# Patient Record
Sex: Male | Born: 1955 | Race: White | Hispanic: No | Marital: Single | Smoking: Never smoker
Health system: Southern US, Community
[De-identification: ages and names within clinical notes are randomized; demographics above are authoritative.]

## PROBLEM LIST (undated history)

## (undated) ENCOUNTER — Emergency Department (HOSPITAL_COMMUNITY): Payer: Self-pay | Source: Home / Self Care

## (undated) DIAGNOSIS — M1990 Unspecified osteoarthritis, unspecified site: Secondary | ICD-10-CM

## (undated) DIAGNOSIS — Z8489 Family history of other specified conditions: Secondary | ICD-10-CM

## (undated) DIAGNOSIS — R51 Headache: Secondary | ICD-10-CM

## (undated) DIAGNOSIS — I10 Essential (primary) hypertension: Secondary | ICD-10-CM

## (undated) DIAGNOSIS — E7800 Pure hypercholesterolemia, unspecified: Secondary | ICD-10-CM

## (undated) DIAGNOSIS — I209 Angina pectoris, unspecified: Secondary | ICD-10-CM

## (undated) DIAGNOSIS — Z87898 Personal history of other specified conditions: Secondary | ICD-10-CM

## (undated) DIAGNOSIS — Z9861 Coronary angioplasty status: Secondary | ICD-10-CM

## (undated) DIAGNOSIS — I2510 Atherosclerotic heart disease of native coronary artery without angina pectoris: Secondary | ICD-10-CM

## (undated) DIAGNOSIS — I2119 ST elevation (STEMI) myocardial infarction involving other coronary artery of inferior wall: Secondary | ICD-10-CM

## (undated) DIAGNOSIS — E119 Type 2 diabetes mellitus without complications: Secondary | ICD-10-CM

## (undated) DIAGNOSIS — R519 Headache, unspecified: Secondary | ICD-10-CM

## (undated) DIAGNOSIS — F419 Anxiety disorder, unspecified: Secondary | ICD-10-CM

## (undated) HISTORY — PX: NASAL SEPTUM SURGERY: SHX37

## (undated) HISTORY — PX: CORONARY ANGIOPLASTY WITH STENT PLACEMENT: SHX49

---

## 1984-06-02 HISTORY — PX: INGUINAL HERNIA REPAIR: SUR1180

## 1984-06-02 HISTORY — PX: CARPAL TUNNEL RELEASE: SHX101

## 2002-11-11 ENCOUNTER — Emergency Department (HOSPITAL_COMMUNITY): Admission: EM | Admit: 2002-11-11 | Discharge: 2002-11-11 | Payer: Self-pay | Admitting: Emergency Medicine

## 2004-05-17 ENCOUNTER — Ambulatory Visit: Payer: Self-pay | Admitting: Family Medicine

## 2005-03-24 ENCOUNTER — Ambulatory Visit: Payer: Self-pay | Admitting: Family Medicine

## 2005-04-14 ENCOUNTER — Ambulatory Visit: Payer: Self-pay | Admitting: Family Medicine

## 2005-05-02 ENCOUNTER — Ambulatory Visit: Payer: Self-pay | Admitting: Family Medicine

## 2005-05-30 ENCOUNTER — Ambulatory Visit: Payer: Self-pay | Admitting: Family Medicine

## 2005-06-27 ENCOUNTER — Ambulatory Visit: Payer: Self-pay | Admitting: Family Medicine

## 2005-07-11 ENCOUNTER — Ambulatory Visit: Payer: Self-pay | Admitting: Family Medicine

## 2013-11-02 DIAGNOSIS — E119 Type 2 diabetes mellitus without complications: Secondary | ICD-10-CM | POA: Insufficient documentation

## 2013-11-02 DIAGNOSIS — E785 Hyperlipidemia, unspecified: Secondary | ICD-10-CM | POA: Insufficient documentation

## 2013-11-02 DIAGNOSIS — I10 Essential (primary) hypertension: Secondary | ICD-10-CM | POA: Insufficient documentation

## 2016-02-13 DIAGNOSIS — E785 Hyperlipidemia, unspecified: Secondary | ICD-10-CM

## 2016-02-13 DIAGNOSIS — E1169 Type 2 diabetes mellitus with other specified complication: Secondary | ICD-10-CM | POA: Insufficient documentation

## 2016-02-13 DIAGNOSIS — I10 Essential (primary) hypertension: Secondary | ICD-10-CM | POA: Insufficient documentation

## 2016-08-04 ENCOUNTER — Inpatient Hospital Stay (HOSPITAL_COMMUNITY)
Admission: EM | Admit: 2016-08-04 | Discharge: 2016-08-08 | DRG: 247 | Disposition: A | Discharge status: Home / Self Care | Payer: Managed Care, Other (non HMO) | Source: Ambulatory Visit | Attending: Cardiovascular Disease | Admitting: Cardiovascular Disease

## 2016-08-04 ENCOUNTER — Encounter (HOSPITAL_COMMUNITY): Payer: Self-pay | Admitting: Cardiology

## 2016-08-04 ENCOUNTER — Encounter (HOSPITAL_COMMUNITY)
Admission: EM | Disposition: A | Discharge status: Home / Self Care | Payer: Self-pay | Source: Ambulatory Visit | Attending: Cardiovascular Disease

## 2016-08-04 DIAGNOSIS — I2119 ST elevation (STEMI) myocardial infarction involving other coronary artery of inferior wall: Secondary | ICD-10-CM

## 2016-08-04 DIAGNOSIS — I2121 ST elevation (STEMI) myocardial infarction involving left circumflex coronary artery: Secondary | ICD-10-CM | POA: Diagnosis present

## 2016-08-04 DIAGNOSIS — E785 Hyperlipidemia, unspecified: Secondary | ICD-10-CM | POA: Diagnosis present

## 2016-08-04 DIAGNOSIS — I2510 Atherosclerotic heart disease of native coronary artery without angina pectoris: Secondary | ICD-10-CM | POA: Insufficient documentation

## 2016-08-04 DIAGNOSIS — E118 Type 2 diabetes mellitus with unspecified complications: Secondary | ICD-10-CM | POA: Diagnosis not present

## 2016-08-04 DIAGNOSIS — Z8249 Family history of ischemic heart disease and other diseases of the circulatory system: Secondary | ICD-10-CM

## 2016-08-04 DIAGNOSIS — R110 Nausea: Secondary | ICD-10-CM | POA: Diagnosis present

## 2016-08-04 DIAGNOSIS — E7800 Pure hypercholesterolemia, unspecified: Secondary | ICD-10-CM | POA: Diagnosis not present

## 2016-08-04 DIAGNOSIS — E119 Type 2 diabetes mellitus without complications: Secondary | ICD-10-CM

## 2016-08-04 DIAGNOSIS — Z955 Presence of coronary angioplasty implant and graft: Secondary | ICD-10-CM

## 2016-08-04 DIAGNOSIS — I10 Essential (primary) hypertension: Secondary | ICD-10-CM

## 2016-08-04 DIAGNOSIS — E088 Diabetes mellitus due to underlying condition with unspecified complications: Secondary | ICD-10-CM | POA: Diagnosis not present

## 2016-08-04 DIAGNOSIS — R079 Chest pain, unspecified: Secondary | ICD-10-CM

## 2016-08-04 HISTORY — DX: Type 2 diabetes mellitus without complications: E11.9

## 2016-08-04 HISTORY — DX: Coronary angioplasty status: Z98.61

## 2016-08-04 HISTORY — DX: Atherosclerotic heart disease of native coronary artery without angina pectoris: I25.10

## 2016-08-04 HISTORY — DX: Anxiety disorder, unspecified: F41.9

## 2016-08-04 HISTORY — DX: Pure hypercholesterolemia, unspecified: E78.00

## 2016-08-04 HISTORY — DX: Headache: R51

## 2016-08-04 HISTORY — PX: CORONARY STENT INTERVENTION: CATH118234

## 2016-08-04 HISTORY — DX: Essential (primary) hypertension: I10

## 2016-08-04 HISTORY — DX: ST elevation (STEMI) myocardial infarction involving other coronary artery of inferior wall: I21.19

## 2016-08-04 HISTORY — PX: LEFT HEART CATH AND CORONARY ANGIOGRAPHY: CATH118249

## 2016-08-04 HISTORY — DX: Angina pectoris, unspecified: I20.9

## 2016-08-04 HISTORY — DX: Personal history of other specified conditions: Z87.898

## 2016-08-04 HISTORY — DX: Headache, unspecified: R51.9

## 2016-08-04 HISTORY — DX: Family history of other specified conditions: Z84.89

## 2016-08-04 HISTORY — DX: Unspecified osteoarthritis, unspecified site: M19.90

## 2016-08-04 LAB — POCT I-STAT, CHEM 8
BUN: 10 mg/dL (ref 6–20)
CALCIUM ION: 1.18 mmol/L (ref 1.15–1.40)
CHLORIDE: 107 mmol/L (ref 101–111)
Creatinine, Ser: 1.2 mg/dL (ref 0.61–1.24)
GLUCOSE: 279 mg/dL — AB (ref 65–99)
HCT: 47 % (ref 39.0–52.0)
Hemoglobin: 16 g/dL (ref 13.0–17.0)
Potassium: 3.8 mmol/L (ref 3.5–5.1)
SODIUM: 145 mmol/L (ref 135–145)
TCO2: 22 mmol/L (ref 0–100)

## 2016-08-04 LAB — GLUCOSE, CAPILLARY
GLUCOSE-CAPILLARY: 229 mg/dL — AB (ref 65–99)
GLUCOSE-CAPILLARY: 174 mg/dL — AB (ref 65–99)

## 2016-08-04 LAB — MRSA PCR SCREENING: MRSA BY PCR: NEGATIVE

## 2016-08-04 LAB — TROPONIN I
Troponin I: 5.72 ng/mL (ref <0.03)
Troponin I: 36.23 ng/mL (ref <0.03)

## 2016-08-04 LAB — POCT ACTIVATED CLOTTING TIME: ACTIVATED CLOTTING TIME: 307 s

## 2016-08-04 SURGERY — LEFT HEART CATH AND CORONARY ANGIOGRAPHY

## 2016-08-04 MED ORDER — ATORVASTATIN CALCIUM 80 MG PO TABS
80.0000 mg | ORAL_TABLET | Freq: Every day | ORAL | Status: DC
  Administered 2016-08-04 – 2016-08-06 (×3): 80 mg via ORAL
  Filled 2016-08-04 (×3): qty 1

## 2016-08-04 MED ORDER — HYDRALAZINE HCL 20 MG/ML IJ SOLN
5.0000 mg | INTRAMUSCULAR | Status: AC | PRN
  Filled 2016-08-04: qty 1

## 2016-08-04 MED ORDER — FENTANYL CITRATE (PF) 100 MCG/2ML IJ SOLN
INTRAMUSCULAR | Status: AC
  Filled 2016-08-04: qty 2

## 2016-08-04 MED ORDER — TICAGRELOR 90 MG PO TABS
90.0000 mg | ORAL_TABLET | Freq: Two times a day (BID) | ORAL | Status: DC
  Administered 2016-08-04 – 2016-08-07 (×6): 90 mg via ORAL
  Filled 2016-08-04 (×6): qty 1

## 2016-08-04 MED ORDER — HEPARIN (PORCINE) IN NACL 2-0.9 UNIT/ML-% IJ SOLN
INTRAMUSCULAR | Status: DC | PRN
  Administered 2016-08-04: 1000 mL

## 2016-08-04 MED ORDER — NITROGLYCERIN IN D5W 200-5 MCG/ML-% IV SOLN
0.0000 ug/min | INTRAVENOUS | Status: DC
  Administered 2016-08-04: 5 ug/min via INTRAVENOUS
  Administered 2016-08-04: 10 ug/min via INTRAVENOUS
  Administered 2016-08-06: 15 ug/min via INTRAVENOUS
  Filled 2016-08-04 (×2): qty 250

## 2016-08-04 MED ORDER — NITROGLYCERIN 1 MG/10 ML FOR IR/CATH LAB
INTRA_ARTERIAL | Status: AC
  Filled 2016-08-04: qty 10

## 2016-08-04 MED ORDER — BIVALIRUDIN 250 MG IV SOLR
INTRAVENOUS | Status: AC
  Filled 2016-08-04: qty 250

## 2016-08-04 MED ORDER — INSULIN ASPART 100 UNIT/ML ~~LOC~~ SOLN
0.0000 [IU] | Freq: Three times a day (TID) | SUBCUTANEOUS | Status: DC
  Administered 2016-08-04: 5 [IU] via SUBCUTANEOUS
  Administered 2016-08-05: 8 [IU] via SUBCUTANEOUS
  Administered 2016-08-05: 5 [IU] via SUBCUTANEOUS
  Administered 2016-08-05: 2 [IU] via SUBCUTANEOUS
  Administered 2016-08-06 (×2): 5 [IU] via SUBCUTANEOUS
  Administered 2016-08-07: 10:00:00 3 [IU] via SUBCUTANEOUS
  Administered 2016-08-07: 13:00:00 5 [IU] via SUBCUTANEOUS
  Administered 2016-08-07: 18:00:00 8 [IU] via SUBCUTANEOUS
  Administered 2016-08-08: 3 [IU] via SUBCUTANEOUS

## 2016-08-04 MED ORDER — SODIUM CHLORIDE 0.9 % IV SOLN
INTRAVENOUS | Status: AC
  Administered 2016-08-04 (×2): via INTRAVENOUS

## 2016-08-04 MED ORDER — SODIUM CHLORIDE 0.9% FLUSH: 3.0000 mL | INTRAVENOUS | Status: DC | PRN

## 2016-08-04 MED ORDER — VERAPAMIL HCL 2.5 MG/ML IV SOLN
INTRA_ARTERIAL | Status: DC | PRN
  Administered 2016-08-04: 10 mL via INTRA_ARTERIAL

## 2016-08-04 MED ORDER — TIROFIBAN HCL IN NACL 5-0.9 MG/100ML-% IV SOLN
INTRAVENOUS | Status: DC | PRN
  Administered 2016-08-04: 0.15 ug/kg/min via INTRAVENOUS

## 2016-08-04 MED ORDER — ONDANSETRON HCL 4 MG/2ML IJ SOLN
4.0000 mg | Freq: Four times a day (QID) | INTRAMUSCULAR | Status: DC | PRN
  Administered 2016-08-05 – 2016-08-06 (×4): 4 mg via INTRAVENOUS
  Filled 2016-08-04 (×4): qty 2

## 2016-08-04 MED ORDER — ASPIRIN 81 MG PO CHEW
81.0000 mg | CHEWABLE_TABLET | Freq: Every day | ORAL | Status: DC
  Administered 2016-08-05 – 2016-08-08 (×4): 81 mg via ORAL
  Filled 2016-08-04 (×5): qty 1

## 2016-08-04 MED ORDER — SODIUM CHLORIDE 0.9% FLUSH
3.0000 mL | Freq: Two times a day (BID) | INTRAVENOUS | Status: DC
  Administered 2016-08-04 – 2016-08-06 (×4): 3 mL via INTRAVENOUS

## 2016-08-04 MED ORDER — LABETALOL HCL 5 MG/ML IV SOLN
10.0000 mg | INTRAVENOUS | Status: AC | PRN
  Administered 2016-08-04: 10 mg via INTRAVENOUS
  Filled 2016-08-04: qty 4

## 2016-08-04 MED ORDER — MORPHINE SULFATE (PF) 2 MG/ML IV SOLN
2.0000 mg | INTRAVENOUS | Status: DC | PRN
  Administered 2016-08-05 (×2): 2 mg via INTRAVENOUS
  Filled 2016-08-04 (×2): qty 1

## 2016-08-04 MED ORDER — METOPROLOL SUCCINATE ER 25 MG PO TB24
12.5000 mg | ORAL_TABLET | Freq: Every day | ORAL | Status: DC
  Administered 2016-08-04 – 2016-08-06 (×3): 12.5 mg via ORAL
  Filled 2016-08-04 (×3): qty 1

## 2016-08-04 MED ORDER — SODIUM CHLORIDE 0.9 % IV SOLN: 250.0000 mL | INTRAVENOUS | Status: DC | PRN

## 2016-08-04 MED ORDER — FENTANYL CITRATE (PF) 100 MCG/2ML IJ SOLN
25.0000 ug | Freq: Once | INTRAMUSCULAR | Status: AC
  Administered 2016-08-04: 25 ug via INTRAVENOUS
  Filled 2016-08-04: qty 2

## 2016-08-04 MED ORDER — TIROFIBAN HCL IN NACL 5-0.9 MG/100ML-% IV SOLN
0.1500 ug/kg/min | INTRAVENOUS | Status: DC
  Administered 2016-08-04 – 2016-08-05 (×4): 0.15 ug/kg/min via INTRAVENOUS
  Filled 2016-08-04 (×3): qty 100

## 2016-08-04 MED ORDER — TIROFIBAN HCL IN NACL 5-0.9 MG/100ML-% IV SOLN
INTRAVENOUS | Status: AC
  Filled 2016-08-04: qty 100

## 2016-08-04 MED ORDER — LIDOCAINE HCL (PF) 1 % IJ SOLN
INTRAMUSCULAR | Status: DC | PRN
  Administered 2016-08-04: 3 mL

## 2016-08-04 MED ORDER — LIDOCAINE HCL (PF) 1 % IJ SOLN
INTRAMUSCULAR | Status: AC
  Filled 2016-08-04: qty 30

## 2016-08-04 MED ORDER — HEPARIN (PORCINE) IN NACL 2-0.9 UNIT/ML-% IJ SOLN
INTRAMUSCULAR | Status: AC
  Filled 2016-08-04: qty 1000

## 2016-08-04 MED ORDER — SODIUM CHLORIDE 0.9 % IV SOLN
1.7500 mg/kg/h | INTRAVENOUS | Status: AC
  Administered 2016-08-04 (×2): 1.75 mg/kg/h via INTRAVENOUS
  Filled 2016-08-04 (×3): qty 250

## 2016-08-04 MED ORDER — TIROFIBAN (AGGRASTAT) BOLUS VIA INFUSION
INTRAVENOUS | Status: DC | PRN
  Administered 2016-08-04: 2150 ug via INTRAVENOUS

## 2016-08-04 MED ORDER — ACETAMINOPHEN 325 MG PO TABS
650.0000 mg | ORAL_TABLET | ORAL | Status: DC | PRN
  Administered 2016-08-05: 650 mg via ORAL
  Filled 2016-08-04: qty 2

## 2016-08-04 MED ORDER — FENTANYL CITRATE (PF) 100 MCG/2ML IJ SOLN
INTRAMUSCULAR | Status: DC | PRN
  Administered 2016-08-04: 50 ug via INTRAVENOUS

## 2016-08-04 MED ORDER — SODIUM CHLORIDE 0.9 % IV SOLN
INTRAVENOUS | Status: DC | PRN
  Administered 2016-08-04 (×2): 1.75 mg/kg/h via INTRAVENOUS

## 2016-08-04 MED ORDER — TICAGRELOR 90 MG PO TABS
ORAL_TABLET | ORAL | Status: DC | PRN
  Administered 2016-08-04: 180 mg via ORAL

## 2016-08-04 MED ORDER — TICAGRELOR 90 MG PO TABS
ORAL_TABLET | ORAL | Status: AC
  Filled 2016-08-04: qty 2

## 2016-08-04 MED ORDER — BIVALIRUDIN BOLUS VIA INFUSION - CUPID
INTRAVENOUS | Status: DC | PRN
  Administered 2016-08-04: 64.5 mg via INTRAVENOUS

## 2016-08-04 SURGICAL SUPPLY — 21 items
BALLN MOZEC 2.0X12 (BALLOONS) ×3
BALLN ~~LOC~~ EUPHORA RX 3.25X15 (BALLOONS) ×3
BALLOON MOZEC 2.0X12 (BALLOONS) ×1 IMPLANT
BALLOON ~~LOC~~ EUPHORA RX 3.25X15 (BALLOONS) ×1 IMPLANT
CATH EXPO 5FR ANG PIGTAIL 145 (CATHETERS) ×3 IMPLANT
CATH EXTRAC PRONTO 5.5F 138CM (CATHETERS) ×3 IMPLANT
CATH OPTITORQUE TIG 4.0 5F (CATHETERS) ×3 IMPLANT
CATH OPTITORQUE TIG 4.5 5F (CATHETERS) ×3 IMPLANT
CATH VISTA GUIDE 6FR XB3.5 (CATHETERS) ×3 IMPLANT
GLIDESHEATH SLEND A-KIT 6F 22G (SHEATH) ×3 IMPLANT
GUIDEWIRE INQWIRE 1.5J.035X260 (WIRE) ×1 IMPLANT
INQWIRE 1.5J .035X260CM (WIRE) ×3
KIT ENCORE 26 ADVANTAGE (KITS) ×3 IMPLANT
KIT HEART LEFT (KITS) ×3 IMPLANT
PACK CARDIAC CATHETERIZATION (CUSTOM PROCEDURE TRAY) ×3 IMPLANT
STENT SYNERGY DES 3X28 (Permanent Stent) ×3 IMPLANT
SYR MEDRAD MARK V 150ML (SYRINGE) ×3 IMPLANT
TRANSDUCER W/STOPCOCK (MISCELLANEOUS) ×3 IMPLANT
TUBING CIL FLEX 10 FLL-RA (TUBING) ×3 IMPLANT
WIRE ASAHI PROWATER 180CM (WIRE) ×3 IMPLANT
WIRE HI TORQ VERSACORE-J 145CM (WIRE) ×3 IMPLANT

## 2016-08-04 NOTE — Progress Notes (Signed)
eLink Physician-Brief Progress Note Patient Name: Fara OldenBobby Koehne DOB: [birth year] MRN: [id number]   Date of Service  08/04/2016  HPI/Events of Note  STy elevation MI Stent, catn done Slight HTN No distress  eICU Interventions  Continued to monitor Camera not working in room Trying to fix     Intervention Category Evaluation Type: New Patient Evaluation  Nelda BucksFEINSTEIN,Jerica Creegan J. 08/04/2016, 4:54 PM

## 2016-08-04 NOTE — Progress Notes (Signed)
eLink Physician-Brief Progress Note Patient Name: Jackson OldenBobby Kercheval DOB: 1955-08-13 MRN: [id number]   Date of Service  08/04/2016  HPI/Events of Note  Had MI  Trop reported Repeat   eICU Interventions       Intervention Category Intermediate Interventions: Diagnostic test evaluation  Nelda BucksFEINSTEIN,Abreanna Drawdy J. 08/04/2016, 6:18 PM

## 2016-08-04 NOTE — Progress Notes (Signed)
   08/04/16 1425  Clinical Encounter Type  Visited With Health care provider  Visit Type Other (Comment) (stemi)  Spiritual Encounters  Spiritual Needs Emotional  Stress Factors  Patient Stress Factors Not reviewed  Pt went to Cath Lab. Asked to be paged if needed.

## 2016-08-04 NOTE — H&P (Signed)
History & Physical    Patient ID: Jackson Booth MRN: [id number], DOB/AGE: 59/30/1957   Admit date: 08/04/2016   Primary Physician: No primary care provider on file. Primary Cardiologist: New  Patient Profile    61 yo male with PMH of HTN, and DM who was working on his roof this afternoon when he developed a sudden onset of chest pain.   Past Medical History    Past Medical History:  Diagnosis Date  . Diabetes mellitus without complication (HCC)   . Hypertension     No past surgical history on file.   Allergies  No Known Allergies  History of Present Illness    Jackson Booth is a 61 yo male with PMH of HTN and DM. Reports he is followed by his PCP for his chronic medical conditions. Denies any hx of CAD. Does report strong family hx fof CAD with mother and brothers having CABG. Reports his younger brother had CABG in his mid 71s. Did have a stress test many years ago that was normal. Reports he was in his usual state of health until this afternoon. He climbed a ladder to work on his roof, was up there for a short period of time when he developed a sudden onset of chest pain with radiation up into the left jaw. Symptoms persisted and he called EMS.   On EMS arrival he was in the bathroom, had a bowel movement. EKG showed SR with ST elevation in II, III, aVF, v5-6 with ST depression in leads v1, v2. He was given 324 ASA and 3 SL nitro with improvement in his pain to 4/10 on arrival to the hospital.    Home Medications    Prior to Admission medications   Not on File    Family History    Family History  Problem Relation Age of Onset  . Hypertension Father     Social History    Social History   Social History  . Marital status: Single    Spouse name: N/A  . Number of children: N/A  . Years of education: N/A   Occupational History  . Not on file.   Social History Main Topics  . Smoking status: Not on file  . Smokeless tobacco: Not on file  . Alcohol use Not on file    . Drug use: Unknown  . Sexual activity: Not on file   Other Topics Concern  . Not on file   Social History Narrative  . No narrative on file     Review of Systems    General:  No chills, fever, night sweats or weight changes.  Cardiovascular:  See HPI Dermatological: No rash, lesions/masses Respiratory: No cough, dyspnea Urologic: No hematuria, dysuria Abdominal:   No nausea, vomiting, diarrhea, bright red blood per rectum, melena, or hematemesis Neurologic:  No visual changes, wkns, changes in mental status. All other systems reviewed and are otherwise negative except as noted above.  Physical Exam    SpO2 100 %.  General: Pleasant WM, NAD Psych: Normal affect. Neuro: Alert and oriented X 3. Moves all extremities spontaneously. HEENT: Normal  Neck: Supple without bruits or JVD. Lungs:  Resp regular and unlabored, CTA. Heart: RRR no s3, s4, or murmurs. Abdomen: Soft, non-tender, non-distended, BS + x 4.  Extremities: No clubbing, cyanosis or edema. DP/PT/Radials 2+ and equal bilaterally.  Labs    Troponin (Point of Care Test) No results for input(s): TROPIPOC in the last 72 hours. No results for input(s): CKTOTAL, CKMB,  TROPONINI in the last 72 hours. No results found for: WBC, HGB, HCT, MCV, PLT No results for input(s): NA, K, CL, CO2, BUN, CREATININE, CALCIUM, PROT, BILITOT, ALKPHOS, ALT, AST, GLUCOSE in the last 168 hours.  Invalid input(s): LABALBU No results found for: CHOL, HDL, LDLCALC, TRIG No results found for: [facility]   Radiology Studies    No results found.  ECG & Cardiac Imaging    EKG: SR with ST elevation in II, III, aVF, v5-6 with ST depression in leads v1, v2  Assessment & Plan    61 yo male with PMH of HTN, and DM who was working on his roof this afternoon when he developed a sudden onset of chest pain.  1. STEMI: He developed centralized chest pain with radiation up into his jaw while he was working on his roof. On EMS arrival noted to have  ST elevation in II, III, aVF, v5-6 with ST depression in leads v1, v2. Given 324 mg ASA and 3SL nitro. Pain 4/10. He was brought the labs emergently for cardiac catheterization with Dr. Allyson SabalBerry.  -- start liptor 80mg  daily  2. HTN: Hypertensive on admission. Will start low dose toprol XL.   3. DM: On meformin and glipzide at home. Hold and start SSI.    Janice CoffinSigned, Lindsay Roberts, NP-C Pager [phone] 08/04/2016, 3:30 PM

## 2016-08-04 NOTE — Progress Notes (Signed)
Pt complaint of 2/10 mid sternal chest pain. Ekg done, Allyson SabalBerry, MD paged and verbal order for Fentanyl 25 mcg x1, also start nitro gtt. Will continue to monitor closely.    Glade LloydMelissa Snow, RN

## 2016-08-05 ENCOUNTER — Encounter (HOSPITAL_COMMUNITY): Payer: Self-pay | Admitting: Cardiovascular Disease

## 2016-08-05 ENCOUNTER — Inpatient Hospital Stay (HOSPITAL_COMMUNITY): Payer: Managed Care, Other (non HMO)

## 2016-08-05 DIAGNOSIS — E088 Diabetes mellitus due to underlying condition with unspecified complications: Secondary | ICD-10-CM

## 2016-08-05 DIAGNOSIS — I2510 Atherosclerotic heart disease of native coronary artery without angina pectoris: Secondary | ICD-10-CM

## 2016-08-05 DIAGNOSIS — I2119 ST elevation (STEMI) myocardial infarction involving other coronary artery of inferior wall: Secondary | ICD-10-CM

## 2016-08-05 LAB — CBC
HCT: 41 % (ref 39.0–52.0)
HEMOGLOBIN: 13.7 g/dL (ref 13.0–17.0)
MCH: 29 pg (ref 26.0–34.0)
MCHC: 33.4 g/dL (ref 30.0–36.0)
MCV: 86.9 fL (ref 78.0–100.0)
PLATELETS: 286 10*3/uL (ref 150–400)
RBC: 4.72 MIL/uL (ref 4.22–5.81)
RDW: 13.6 % (ref 11.5–15.5)
WBC: 12.7 10*3/uL — AB (ref 4.0–10.5)

## 2016-08-05 LAB — GLUCOSE, CAPILLARY
GLUCOSE-CAPILLARY: 142 mg/dL — AB (ref 65–99)
GLUCOSE-CAPILLARY: 248 mg/dL — AB (ref 65–99)
GLUCOSE-CAPILLARY: 262 mg/dL — AB (ref 65–99)
Glucose-Capillary: 104 mg/dL — ABNORMAL HIGH (ref 65–99)

## 2016-08-05 LAB — BASIC METABOLIC PANEL
ANION GAP: 8 (ref 5–15)
BUN: 8 mg/dL (ref 6–20)
CALCIUM: 8.5 mg/dL — AB (ref 8.9–10.3)
CO2: 21 mmol/L — AB (ref 22–32)
Chloride: 109 mmol/L (ref 101–111)
Creatinine, Ser: 0.99 mg/dL (ref 0.61–1.24)
Glucose, Bld: 204 mg/dL — ABNORMAL HIGH (ref 65–99)
Potassium: 3.5 mmol/L (ref 3.5–5.1)
SODIUM: 138 mmol/L (ref 135–145)

## 2016-08-05 LAB — ECHOCARDIOGRAM COMPLETE
HEIGHTINCHES: 69 in
WEIGHTICAEL: 3038.4 [oz_av]

## 2016-08-05 LAB — TROPONIN I: Troponin I: 31.05 ng/mL (ref <0.03)

## 2016-08-05 MED ORDER — PERFLUTREN LIPID MICROSPHERE
1.0000 mL | INTRAVENOUS | Status: AC | PRN
  Administered 2016-08-05: 3 mL via INTRAVENOUS
  Filled 2016-08-05: qty 10

## 2016-08-05 MED ORDER — PERFLUTREN LIPID MICROSPHERE
INTRAVENOUS | Status: AC
  Administered 2016-08-05: 3 mL via INTRAVENOUS
  Filled 2016-08-05: qty 10

## 2016-08-05 MED ORDER — OXYCODONE-ACETAMINOPHEN 5-325 MG PO TABS
1.0000 | ORAL_TABLET | ORAL | Status: DC | PRN
  Administered 2016-08-05: 2 via ORAL
  Administered 2016-08-05: 1 via ORAL
  Administered 2016-08-05: 2 via ORAL
  Administered 2016-08-06 – 2016-08-07 (×6): 1 via ORAL
  Filled 2016-08-05 (×4): qty 1
  Filled 2016-08-05: qty 2
  Filled 2016-08-05 (×3): qty 1
  Filled 2016-08-05: qty 2

## 2016-08-05 MED ORDER — ENOXAPARIN SODIUM 40 MG/0.4ML ~~LOC~~ SOLN
40.0000 mg | SUBCUTANEOUS | Status: DC
  Administered 2016-08-05 – 2016-08-07 (×3): 40 mg via SUBCUTANEOUS
  Filled 2016-08-05 (×3): qty 0.4

## 2016-08-05 NOTE — Care Management Note (Signed)
Case Management Note  Patient Details  Name: Jackson Booth MRN: [id number] Date of Birth: 1956/01/31  Subjective/Objective:  Pt lives alone, has significant other that he has "dated for 30 years" per his mother.  CM will provide Brilinta 30-day free trial card and $18 copay card for 3 month supply.                                   Expected Discharge Plan:  Home/Self Care  Discharge planning Services  CM Consult, Medication Assistance  Status of Service:  In process, will continue to follow  Magdalene RiverMayo, Oswin Johal T, RN 08/05/2016, 3:08 PM

## 2016-08-05 NOTE — Progress Notes (Signed)
Patient troponin elevated- to be expected- MD was called and notified. Pt complaining of chest pain as a "discomfort" and I also notified the MD.  NO new orders a this time.

## 2016-08-05 NOTE — Progress Notes (Signed)
Progress Note  Patient Name: Qaadir Kent Date of Encounter: 08/05/2016  Primary Cardiologist: Jannifer Hick  Subjective   Feels better. Less CP (mild). No SOB.  Had nausea. Zofran. Had jaw and chest pain on roof.   Inpatient Medications    Scheduled Meds: . aspirin  81 mg Oral Daily  . atorvastatin  80 mg Oral q1800  . insulin aspart  0-15 Units Subcutaneous TID WC  . metoprolol succinate  12.5 mg Oral Daily  . sodium chloride flush  3 mL Intravenous Q12H  . ticagrelor  90 mg Oral BID   Continuous Infusions: . nitroGLYCERIN 30 mcg/min (08/05/16 0900)   PRN Meds: sodium chloride, acetaminophen, morphine injection, ondansetron (ZOFRAN) IV, sodium chloride flush   Vital Signs    Vitals:   08/05/16 0615 08/05/16 0700 08/05/16 0756 08/05/16 0800  BP: 137/90 139/82  (!) 146/80  Pulse: (!) 52 71  63  Resp: 12 14  13   Temp:   98.3 F (36.8 C)   TempSrc:   Oral   SpO2: 97% 97%  97%  Weight:      Height:        Intake/Output Summary (Last 24 hours) at 08/05/16 1021 Last data filed at 08/05/16 0900  Gross per 24 hour  Intake          1892.32 ml  Output             1750 ml  Net           142.32 ml   Filed Weights   08/04/16 1610  Weight: 189 lb 14.4 oz (86.1 kg)    Telemetry    No VT - Personally Reviewed  ECG    SR PRWP, lateral infact pattern - Personally Reviewed  Physical Exam   GEN: No acute distress.   Neck: No JVD Cardiac: RRR, no murmurs, rubs, or gallops.  Respiratory: Clear to auscultation bilaterally. GI: Soft, nontender, non-distended  MS: No edema; No deformity. Cath site normal.  Neuro:  Nonfocal  Psych: Normal affect   Labs    Chemistry Recent Labs Lab 08/04/16 1437 08/05/16 0342  NA 145 138  K 3.8 3.5  CL 107 109  CO2  --  21*  GLUCOSE 279* 204*  BUN 10 8  CREATININE 1.20 0.99  CALCIUM  --  8.5*  GFRNONAA  --  >60  GFRAA  --  >60  ANIONGAP  --  8     Hematology Recent Labs Lab 08/04/16 1437 08/05/16 0342  WBC  --   12.7*  RBC  --  4.72  HGB 16.0 13.7  HCT 47.0 41.0  MCV  --  86.9  MCH  --  29.0  MCHC  --  33.4  RDW  --  13.6  PLT  --  286    Cardiac Enzymes Recent Labs Lab 08/04/16 1622 08/04/16 2140 08/05/16 0342  TROPONINI 5.72* 36.23* 31.05*   No results for input(s): TROPIPOC in the last 168 hours.   BNPNo results for input(s): BNP, PROBNP in the last 168 hours.   DDimer No results for input(s): DDIMER in the last 168 hours.   Radiology    No results found.  Cardiac Studies   Cath 08/04/16  Mid RCA lesion, 90 %stenosed.  Ost Cx lesion, 40 %stenosed.  Ost 1st Diag to 1st Diag lesion, 90 %stenosed.  Mid Cx lesion, 100 %stenosed.  Post intervention, there is a 0% residual stenosis.  A stent was successfully placed.  There is  mild left ventricular systolic dysfunction.  LV end diastolic pressure is mildly elevated.  The left ventricular ejection fraction is 45-50% by visual estimate.  Patient Profile     61 y.o. male with inferolateral STEMI (circ stent)  Assessment & Plan    Inferolateral STEMI  - DAPT one year  - high intensity statin  - Bb as tolerated  - cardiac rehab  - EF 45-50 in cath lab  - Has residual RCA lesion 90% -- plan on PCI Thursday. Dr. Allyson SabalBerry.   - Wean down NTG IV (HA). Percocet for pain. Zofran for nausea.   DM with vascular complications  - per PCP, meds reviewed  Essential hypertension  - BP controlled.   Family history of CAD  - mother and brother CABG  Keep in CCU today  Signed, Donato SchultzMark Erian Rosengren, MD  08/05/2016, 10:21 AM

## 2016-08-05 NOTE — Progress Notes (Signed)
CARDIAC REHAB PHASE I   PRE:  Rate/Rhythm: 76 SR    BP: sitting 137/81    SaO2:   MODE:  Ambulation: 350 ft   POST:  Rate/Rhythm: 86 SR    BP: sitting 137/86     SaO2:   Pt stiff getting OOB and standing. Sts he has significant arthritis, esp when he has been still for a while. He did feel like his chest was "tired" before walking however this resolved with walking. Felt good walking. To recliner. Began ed including MI, stent, Brilinta. Pt not very keen on taking meds and admits that he only takes half of BP and DM meds that he is prescribed. Discussed the importance of Brilinta and statins. Encouraged him to prepare himself to take them. Will f/u tomorrow, encouraged more walking.  [phone]   Harriet MassonRandi Kristan Talayia Hjort CES, ACSM 08/05/2016 2:35 PM

## 2016-08-05 NOTE — Progress Notes (Signed)
Paged Cardiology and spoke with Fellow on call about patient's elevated BP.   Orders to restart patient on Nitro gtt, and no new orders for PRN BP medications given.   MD to assess patient in the morning.  Will continue to monitor patient.   Ionia Schey E Mylinda LatinaNino Combs, [place]RN

## 2016-08-05 NOTE — Progress Notes (Signed)
  Echocardiogram 2D Echocardiogram with Definity has been performed.  Leta JunglingCooper, Deaja Rizo M 08/05/2016, 10:52 AM

## 2016-08-06 LAB — GLUCOSE, CAPILLARY
GLUCOSE-CAPILLARY: 120 mg/dL — AB (ref 65–99)
Glucose-Capillary: 270 mg/dL — ABNORMAL HIGH (ref 65–99)
Glucose-Capillary: 227 mg/dL — ABNORMAL HIGH (ref 65–99)

## 2016-08-06 MED ORDER — METOPROLOL SUCCINATE ER 25 MG PO TB24
25.0000 mg | ORAL_TABLET | Freq: Every day | ORAL | Status: DC
  Administered 2016-08-07 – 2016-08-08 (×2): 25 mg via ORAL
  Filled 2016-08-06 (×2): qty 1

## 2016-08-06 MED ORDER — METOPROLOL SUCCINATE ER 25 MG PO TB24
12.5000 mg | ORAL_TABLET | Freq: Every day | ORAL | Status: DC
  Administered 2016-08-06: 12.5 mg via ORAL
  Filled 2016-08-06: qty 1

## 2016-08-06 NOTE — Progress Notes (Signed)
 Progress Note  Patient Name: Jackson Booth Date of Encounter: 08/06/2016  Primary Cardiologist: [place]  Subjective   Feels better. HTN last night.  Had jaw and chest pain on roof. Mild nausea.   Inpatient Medications    Scheduled Meds: . aspirin  81 mg Oral Daily  . atorvastatin  80 mg Oral q1800  . enoxaparin (LOVENOX) injection  40 mg Subcutaneous Q24H  . insulin aspart  0-15 Units Subcutaneous TID WC  . metoprolol succinate  12.5 mg Oral Daily  . sodium chloride flush  3 mL Intravenous Q12H  . ticagrelor  90 mg Oral BID   Continuous Infusions: . nitroGLYCERIN Stopped (08/06/16 0800)   PRN Meds: sodium chloride, acetaminophen, morphine injection, ondansetron (ZOFRAN) IV, oxyCODONE-acetaminophen, sodium chloride flush   Vital Signs    Vitals:   08/06/16 0500 08/06/16 0600 08/06/16 0700 08/06/16 0800  BP: 121/84 134/88 98/80 106/81  Pulse:      Resp: 11 14 (!) 9 18  Temp:    98.9 F (37.2 C)  TempSrc:    Oral  SpO2:   97% 98%  Weight:      Height:        Intake/Output Summary (Last 24 hours) at 08/06/16 1016 Last data filed at 08/06/16 0800  Gross per 24 hour  Intake              849 ml  Output             1450 ml  Net             -601 ml   Filed Weights   08/04/16 1610  Weight: 189 lb 14.4 oz (86.1 kg)    Telemetry    No VT - Personally Reviewed  ECG    SR PRWP, lateral infact pattern - Personally Reviewed  Physical Exam   GEN: No acute distress.   Neck: No JVD Cardiac: RRR, no murmurs, rubs, or gallops.  Respiratory: Clear to auscultation bilaterally. GI: Soft, nontender, non-distended  MS: No edema; No deformity. Cath site normal.  Neuro:  Nonfocal  Psych: Normal affect   Labs    Chemistry  Recent Labs Lab 08/04/16 1437 08/05/16 0342  NA 145 138  K 3.8 3.5  CL 107 109  CO2  --  21*  GLUCOSE 279* 204*  BUN 10 8  CREATININE 1.20 0.99  CALCIUM  --  8.5*  GFRNONAA  --  >60  GFRAA  --  >60  ANIONGAP  --  8      Hematology  Recent Labs Lab 08/04/16 1437 08/05/16 0342  WBC  --  12.7*  RBC  --  4.72  HGB 16.0 13.7  HCT 47.0 41.0  MCV  --  86.9  MCH  --  29.0  MCHC  --  33.4  RDW  --  13.6  PLT  --  286    Cardiac Enzymes  Recent Labs Lab 08/04/16 1622 08/04/16 2140 08/05/16 0342  TROPONINI 5.72* 36.23* 31.05*   No results for input(s): TROPIPOC in the last 168 hours.   BNPNo results for input(s): BNP, PROBNP in the last 168 hours.   DDimer No results for input(s): DDIMER in the last 168 hours.   Radiology    No results found.  Cardiac Studies   Cath 08/04/16  Mid RCA lesion, 90 %stenosed.  Ost Cx lesion, 40 %stenosed.  Ost 1st Diag to 1st Diag lesion, 90 %stenosed.  Mid Cx lesion, 100 %stenosed.  Post intervention,   there is a 0% residual stenosis.  A stent was successfully placed.  There is mild left ventricular systolic dysfunction.  LV end diastolic pressure is mildly elevated.  The left ventricular ejection fraction is 45-50% by visual estimate.  Patient Profile     60 y.o. male with inferolateral STEMI (circ stent)  Assessment & Plan    Inferolateral STEMI  - DAPT one year  - high intensity statin  - Bb as tolerated  - cardiac rehab  - EF 45-50 in cath lab  - Has residual RCA lesion 90% -- plan on PCI Thursday. Dr. Berry.   - Off NTG IV (HA). Percocet for pain, using sparingly. Zofran for nausea.   - Will keep here for now. If we need bed can send to step down.   DM with vascular complications  - per PCP, meds reviewed  Essential hypertension  - BP controlled now.   Family history of CAD  - mother and brother CABG    Signed, Mark Skains, MD  08/06/2016, 10:16 AM    

## 2016-08-06 NOTE — Progress Notes (Signed)
CARDIAC REHAB PHASE I   PRE:  Rate/Rhythm: 83 SR  BP:  Supine: 152/87  Sitting:   Standing:    SaO2:   MODE:  Ambulation: 470 ft   POST:  Rate/Rhythm: 100 SR  BP:  Supine:   Sitting: 138/85  Standing:    SaO2: 97%RA 1150-1210 Pt walked 470 ft on RA with steady gait. No CP. To recliner after walk.    Jackson Nuttingharlene Stefon Ramthun, RN BSN  08/06/2016 12:06 PM

## 2016-08-07 ENCOUNTER — Encounter (HOSPITAL_COMMUNITY)
Admission: EM | Disposition: A | Discharge status: Home / Self Care | Payer: Self-pay | Source: Ambulatory Visit | Attending: Cardiovascular Disease

## 2016-08-07 ENCOUNTER — Encounter (HOSPITAL_COMMUNITY): Payer: Self-pay | Admitting: General Practice

## 2016-08-07 DIAGNOSIS — Z9861 Coronary angioplasty status: Secondary | ICD-10-CM

## 2016-08-07 DIAGNOSIS — I2510 Atherosclerotic heart disease of native coronary artery without angina pectoris: Secondary | ICD-10-CM

## 2016-08-07 HISTORY — DX: Coronary angioplasty status: Z98.61

## 2016-08-07 HISTORY — DX: Atherosclerotic heart disease of native coronary artery without angina pectoris: I25.10

## 2016-08-07 HISTORY — PX: CORONARY STENT INTERVENTION: CATH118234

## 2016-08-07 LAB — POCT ACTIVATED CLOTTING TIME: Activated Clotting Time: 351 seconds

## 2016-08-07 LAB — GLUCOSE, CAPILLARY
GLUCOSE-CAPILLARY: 165 mg/dL — AB (ref 65–99)
GLUCOSE-CAPILLARY: 268 mg/dL — AB (ref 65–99)
GLUCOSE-CAPILLARY: 241 mg/dL — AB (ref 65–99)
Glucose-Capillary: 151 mg/dL — ABNORMAL HIGH (ref 65–99)
Glucose-Capillary: 216 mg/dL — ABNORMAL HIGH (ref 65–99)

## 2016-08-07 LAB — PROTIME-INR
INR: 1.02
PROTHROMBIN TIME: 13.4 s (ref 11.4–15.2)

## 2016-08-07 SURGERY — CORONARY STENT INTERVENTION

## 2016-08-07 MED ORDER — FENTANYL CITRATE (PF) 100 MCG/2ML IJ SOLN
INTRAMUSCULAR | Status: AC
  Filled 2016-08-07: qty 2

## 2016-08-07 MED ORDER — MORPHINE SULFATE (PF) 2 MG/ML IV SOLN: 2.0000 mg | INTRAVENOUS | Status: DC | PRN

## 2016-08-07 MED ORDER — HEPARIN (PORCINE) IN NACL 2-0.9 UNIT/ML-% IJ SOLN
INTRAMUSCULAR | Status: DC | PRN
  Administered 2016-08-07: 1000 mL

## 2016-08-07 MED ORDER — ASPIRIN 81 MG PO CHEW: 81.0000 mg | CHEWABLE_TABLET | Freq: Every day | ORAL | Status: DC

## 2016-08-07 MED ORDER — ACETAMINOPHEN 325 MG PO TABS: 650.0000 mg | ORAL_TABLET | ORAL | Status: DC | PRN

## 2016-08-07 MED ORDER — TICAGRELOR 90 MG PO TABS
90.0000 mg | ORAL_TABLET | Freq: Two times a day (BID) | ORAL | Status: DC
  Administered 2016-08-07 – 2016-08-08 (×2): 90 mg via ORAL
  Filled 2016-08-07 (×2): qty 1

## 2016-08-07 MED ORDER — IOPAMIDOL (ISOVUE-370) INJECTION 76%
INTRAVENOUS | Status: DC | PRN
  Administered 2016-08-07: 100 mL via INTRA_ARTERIAL

## 2016-08-07 MED ORDER — SODIUM CHLORIDE 0.9 % IV SOLN
INTRAVENOUS | Status: DC | PRN
  Administered 2016-08-07: 1.75 mg/kg/h via INTRAVENOUS

## 2016-08-07 MED ORDER — HEPARIN SODIUM (PORCINE) 1000 UNIT/ML IJ SOLN
INTRAMUSCULAR | Status: AC
  Filled 2016-08-07: qty 1

## 2016-08-07 MED ORDER — LABETALOL HCL 5 MG/ML IV SOLN: 10.0000 mg | INTRAVENOUS | Status: AC | PRN

## 2016-08-07 MED ORDER — SODIUM CHLORIDE 0.9 % IV SOLN: 250.0000 mL | INTRAVENOUS | Status: DC | PRN

## 2016-08-07 MED ORDER — LIDOCAINE HCL (PF) 1 % IJ SOLN
INTRAMUSCULAR | Status: AC
  Filled 2016-08-07: qty 30

## 2016-08-07 MED ORDER — HYDRALAZINE HCL 20 MG/ML IJ SOLN: 5.0000 mg | INTRAMUSCULAR | Status: AC | PRN

## 2016-08-07 MED ORDER — SODIUM CHLORIDE 0.9% FLUSH
3.0000 mL | Freq: Two times a day (BID) | INTRAVENOUS | Status: DC
  Administered 2016-08-07: 3 mL via INTRAVENOUS

## 2016-08-07 MED ORDER — SODIUM CHLORIDE 0.9% FLUSH: 3.0000 mL | Freq: Two times a day (BID) | INTRAVENOUS | Status: DC

## 2016-08-07 MED ORDER — SODIUM CHLORIDE 0.9% FLUSH: 3.0000 mL | INTRAVENOUS | Status: DC | PRN

## 2016-08-07 MED ORDER — ASPIRIN 81 MG PO CHEW: 81.0000 mg | CHEWABLE_TABLET | ORAL | Status: DC

## 2016-08-07 MED ORDER — SODIUM CHLORIDE 0.9% FLUSH
3.0000 mL | INTRAVENOUS | Status: DC | PRN
Start: 2016-08-07 — End: 2016-08-08

## 2016-08-07 MED ORDER — FENTANYL CITRATE (PF) 100 MCG/2ML IJ SOLN
INTRAMUSCULAR | Status: DC | PRN
  Administered 2016-08-07: 25 ug via INTRAVENOUS

## 2016-08-07 MED ORDER — SODIUM CHLORIDE 0.9 % IV SOLN: INTRAVENOUS | Status: AC

## 2016-08-07 MED ORDER — MIDAZOLAM HCL 2 MG/2ML IJ SOLN
INTRAMUSCULAR | Status: AC
  Filled 2016-08-07: qty 2

## 2016-08-07 MED ORDER — BIVALIRUDIN BOLUS VIA INFUSION - CUPID
INTRAVENOUS | Status: DC | PRN
  Administered 2016-08-07: 61.5 mg via INTRAVENOUS

## 2016-08-07 MED ORDER — ONDANSETRON HCL 4 MG/2ML IJ SOLN: 4.0000 mg | Freq: Four times a day (QID) | INTRAMUSCULAR | Status: DC | PRN

## 2016-08-07 MED ORDER — SODIUM CHLORIDE 0.9 % WEIGHT BASED INFUSION: 1.0000 mL/kg/h | INTRAVENOUS | Status: DC

## 2016-08-07 MED ORDER — LIDOCAINE HCL (PF) 1 % IJ SOLN
INTRAMUSCULAR | Status: DC | PRN
  Administered 2016-08-07: 2 mL

## 2016-08-07 MED ORDER — VERAPAMIL HCL 2.5 MG/ML IV SOLN
INTRAVENOUS | Status: AC
  Filled 2016-08-07: qty 2

## 2016-08-07 MED ORDER — HEART ATTACK BOUNCING BOOK
Freq: Once | Status: AC
  Administered 2016-08-07: 20:00:00
  Filled 2016-08-07: qty 1

## 2016-08-07 MED ORDER — THE SENSUOUS HEART BOOK
Freq: Once | Status: AC
  Administered 2016-08-07: 20:00:00
  Filled 2016-08-07: qty 1

## 2016-08-07 MED ORDER — BIVALIRUDIN 250 MG IV SOLR
INTRAVENOUS | Status: AC
  Filled 2016-08-07: qty 250

## 2016-08-07 MED ORDER — ACTIVE PARTNERSHIP FOR HEALTH OF YOUR HEART BOOK
Freq: Once | Status: AC
  Administered 2016-08-07: 20:00:00
  Filled 2016-08-07: qty 1

## 2016-08-07 MED ORDER — MIDAZOLAM HCL 2 MG/2ML IJ SOLN
INTRAMUSCULAR | Status: DC | PRN
Start: 2016-08-07 — End: 2016-08-07
  Administered 2016-08-07: 1 mg via INTRAVENOUS

## 2016-08-07 MED ORDER — ANGIOPLASTY BOOK
Freq: Once | Status: AC
  Administered 2016-08-07: 20:00:00
  Filled 2016-08-07: qty 1

## 2016-08-07 MED ORDER — NITROGLYCERIN 1 MG/10 ML FOR IR/CATH LAB
INTRA_ARTERIAL | Status: AC
  Filled 2016-08-07: qty 10

## 2016-08-07 MED ORDER — SODIUM CHLORIDE 0.9 % WEIGHT BASED INFUSION: 3.0000 mL/kg/h | INTRAVENOUS | Status: DC

## 2016-08-07 MED ORDER — IOPAMIDOL (ISOVUE-370) INJECTION 76%
INTRAVENOUS | Status: AC
  Filled 2016-08-07: qty 50

## 2016-08-07 MED ORDER — VERAPAMIL HCL 2.5 MG/ML IV SOLN
INTRA_ARTERIAL | Status: DC | PRN
  Administered 2016-08-07: 15 mL via INTRA_ARTERIAL

## 2016-08-07 MED ORDER — IOPAMIDOL (ISOVUE-370) INJECTION 76%
INTRAVENOUS | Status: AC
  Filled 2016-08-07: qty 100

## 2016-08-07 SURGICAL SUPPLY — 18 items
BALLN MOZEC 2.0X12 (BALLOONS) ×3
BALLN ~~LOC~~ EUPHORA RX 3.25X12 (BALLOONS) ×3
BALLOON MOZEC 2.0X12 (BALLOONS) ×1 IMPLANT
BALLOON ~~LOC~~ EUPHORA RX 3.25X12 (BALLOONS) ×1 IMPLANT
CATH OPTITORQUE TIG 4.0 5F (CATHETERS) ×3 IMPLANT
CATH VISTA GUIDE 6FR JR4 (CATHETERS) ×3 IMPLANT
DEVICE RAD COMP TR BAND LRG (VASCULAR PRODUCTS) ×3 IMPLANT
GLIDESHEATH SLEND A-KIT 6F 22G (SHEATH) ×3 IMPLANT
GUIDEWIRE INQWIRE 1.5J.035X260 (WIRE) ×1 IMPLANT
INQWIRE 1.5J .035X260CM (WIRE) ×3
KIT ENCORE 26 ADVANTAGE (KITS) ×6 IMPLANT
KIT HEART LEFT (KITS) ×3 IMPLANT
PACK CARDIAC CATHETERIZATION (CUSTOM PROCEDURE TRAY) ×3 IMPLANT
STENT SYNERGY DES 3X16 (Permanent Stent) ×3 IMPLANT
TRANSDUCER W/STOPCOCK (MISCELLANEOUS) ×3 IMPLANT
TUBING CIL FLEX 10 FLL-RA (TUBING) ×3 IMPLANT
WIRE ASAHI PROWATER 180CM (WIRE) ×3 IMPLANT
WIRE HI TORQ VERSACORE-J 145CM (WIRE) ×3 IMPLANT

## 2016-08-07 NOTE — Progress Notes (Signed)
Pt to Cath lab

## 2016-08-07 NOTE — Interval H&P Note (Signed)
Cath Lab Visit (complete for each Cath Lab visit)  Clinical Evaluation Leading to the Procedure:   ACS: Yes.    Non-ACS:    Anginal Classification: CCS III  Anti-ischemic medical therapy: No Therapy  Non-Invasive Test Results: No non-invasive testing performed  Prior CABG: No previous CABG      History and Physical Interval Note:  08/07/2016 7:41 AM  Jackson Booth  has presented today for surgery, with the diagnosis of cad  The various methods of treatment have been discussed with the patient and family. After consideration of risks, benefits and other options for treatment, the patient has consented to  Procedure(s): Coronary Stent Intervention (N/A) as a surgical intervention .  The patient's history has been reviewed, patient examined, no change in status, stable for surgery.  I have reviewed the patient's chart and labs.  Questions were answered to the patient's satisfaction.     Nanetta BattyBerry, Daegen Berrocal

## 2016-08-07 NOTE — Care Management Note (Addendum)
Case Management Note original note created by [facility] Mayo 08/07/16  Patient Details  Name: Jackson Booth MRN: [id number] Date of Birth: [birth year]  Subjective/Objective:  Pt lives alone, has significant other that he has "dated for 30 years" per his mother.  CM will provide Brilinta 30-day free trial card and $18 copay card for 3 month supply.                                   Expected Discharge Plan:  Home/Self Care  Discharge planning Services  CM Consult, Medication Assistance  Status of Service:  In process, will continue to follow   08/07/2016 Pt alert and oriented.  Pt is completely independent and planning on staying with parents post discharge.  Pt did not have Brilinta cards - CM provided both free 30 and copay card.  [place] [facility] [place] - CM contacted pharmacy and was informed they can fill medication .  CM submitted benefit check.  Cherylann ParrClaxton, Milcah Dulany S, RN 08/07/2016, 3:47 PM

## 2016-08-07 NOTE — Progress Notes (Signed)
TR BAND REMOVAL  LOCATION:    right radial  DEFLATED PER PROTOCOL:    Yes.    TIME BAND OFF / DRESSING APPLIED:    1230   SITE UPON ARRIVAL:    Level 0 (existing old bruise distal to TRB from prev cath)  SITE AFTER BAND REMOVAL:    Level 0  CIRCULATION SENSATION AND MOVEMENT:    Within Normal Limits   Yes.    COMMENTS:   Tolerated procedure well

## 2016-08-07 NOTE — H&P (View-Only) (Signed)
Progress Note  Patient Name: Jackson Booth Date of Encounter: 08/06/2016  Primary Cardiologist: Jannifer Hick  Subjective   Feels better. HTN last night.  Had jaw and chest pain on roof. Mild nausea.   Inpatient Medications    Scheduled Meds: . aspirin  81 mg Oral Daily  . atorvastatin  80 mg Oral q1800  . enoxaparin (LOVENOX) injection  40 mg Subcutaneous Q24H  . insulin aspart  0-15 Units Subcutaneous TID WC  . metoprolol succinate  12.5 mg Oral Daily  . sodium chloride flush  3 mL Intravenous Q12H  . ticagrelor  90 mg Oral BID   Continuous Infusions: . nitroGLYCERIN Stopped (08/06/16 0800)   PRN Meds: sodium chloride, acetaminophen, morphine injection, ondansetron (ZOFRAN) IV, oxyCODONE-acetaminophen, sodium chloride flush   Vital Signs    Vitals:   08/06/16 0500 08/06/16 0600 08/06/16 0700 08/06/16 0800  BP: 121/84 134/88 98/80 106/81  Pulse:      Resp: 11 14 (!) 9 18  Temp:    98.9 F (37.2 C)  TempSrc:    Oral  SpO2:   97% 98%  Weight:      Height:        Intake/Output Summary (Last 24 hours) at 08/06/16 1016 Last data filed at 08/06/16 0800  Gross per 24 hour  Intake              849 ml  Output             1450 ml  Net             -601 ml   Filed Weights   08/04/16 1610  Weight: 189 lb 14.4 oz (86.1 kg)    Telemetry    No VT - Personally Reviewed  ECG    SR PRWP, lateral infact pattern - Personally Reviewed  Physical Exam   GEN: No acute distress.   Neck: No JVD Cardiac: RRR, no murmurs, rubs, or gallops.  Respiratory: Clear to auscultation bilaterally. GI: Soft, nontender, non-distended  MS: No edema; No deformity. Cath site normal.  Neuro:  Nonfocal  Psych: Normal affect   Labs    Chemistry  Recent Labs Lab 08/04/16 1437 08/05/16 0342  NA 145 138  K 3.8 3.5  CL 107 109  CO2  --  21*  GLUCOSE 279* 204*  BUN 10 8  CREATININE 1.20 0.99  CALCIUM  --  8.5*  GFRNONAA  --  >60  GFRAA  --  >60  ANIONGAP  --  8      Hematology  Recent Labs Lab 08/04/16 1437 08/05/16 0342  WBC  --  12.7*  RBC  --  4.72  HGB 16.0 13.7  HCT 47.0 41.0  MCV  --  86.9  MCH  --  29.0  MCHC  --  33.4  RDW  --  13.6  PLT  --  286    Cardiac Enzymes  Recent Labs Lab 08/04/16 1622 08/04/16 2140 08/05/16 0342  TROPONINI 5.72* 36.23* 31.05*   No results for input(s): TROPIPOC in the last 168 hours.   BNPNo results for input(s): BNP, PROBNP in the last 168 hours.   DDimer No results for input(s): DDIMER in the last 168 hours.   Radiology    No results found.  Cardiac Studies   Cath 08/04/16  Mid RCA lesion, 90 %stenosed.  Ost Cx lesion, 40 %stenosed.  Ost 1st Diag to 1st Diag lesion, 90 %stenosed.  Mid Cx lesion, 100 %stenosed.  Post intervention,  there is a 0% residual stenosis.  A stent was successfully placed.  There is mild left ventricular systolic dysfunction.  LV end diastolic pressure is mildly elevated.  The left ventricular ejection fraction is 45-50% by visual estimate.  Patient Profile     61 y.o. male with inferolateral STEMI (circ stent)  Assessment & Plan    Inferolateral STEMI  - DAPT one year  - high intensity statin  - Bb as tolerated  - cardiac rehab  - EF 45-50 in cath lab  - Has residual RCA lesion 90% -- plan on PCI Thursday. Dr. Allyson SabalBerry.   - Off NTG IV (HA). Percocet for pain, using sparingly. Zofran for nausea.   - Will keep here for now. If we need bed can send to step down.   DM with vascular complications  - per PCP, meds reviewed  Essential hypertension  - BP controlled now.   Family history of CAD  - mother and brother CABG    Signed, Donato SchultzMark Nazifa Trinka, MD  08/06/2016, 10:16 AM

## 2016-08-08 ENCOUNTER — Encounter (HOSPITAL_COMMUNITY): Payer: Self-pay | Admitting: Physician Assistant

## 2016-08-08 DIAGNOSIS — E119 Type 2 diabetes mellitus without complications: Secondary | ICD-10-CM

## 2016-08-08 DIAGNOSIS — I10 Essential (primary) hypertension: Secondary | ICD-10-CM

## 2016-08-08 DIAGNOSIS — E7800 Pure hypercholesterolemia, unspecified: Secondary | ICD-10-CM

## 2016-08-08 DIAGNOSIS — E785 Hyperlipidemia, unspecified: Secondary | ICD-10-CM

## 2016-08-08 DIAGNOSIS — E118 Type 2 diabetes mellitus with unspecified complications: Secondary | ICD-10-CM

## 2016-08-08 LAB — BASIC METABOLIC PANEL
ANION GAP: 10 (ref 5–15)
BUN: 13 mg/dL (ref 6–20)
CO2: 23 mmol/L (ref 22–32)
Calcium: 8.6 mg/dL — ABNORMAL LOW (ref 8.9–10.3)
Chloride: 105 mmol/L (ref 101–111)
Creatinine, Ser: 1.13 mg/dL (ref 0.61–1.24)
GFR calc non Af Amer: >60 mL/min (ref >60)
Glucose, Bld: 145 mg/dL — ABNORMAL HIGH (ref 65–99)
Potassium: 4 mmol/L (ref 3.5–5.1)
Sodium: 138 mmol/L (ref 135–145)

## 2016-08-08 LAB — CBC
HCT: 44.9 % (ref 39.0–52.0)
HEMOGLOBIN: 14.8 g/dL (ref 13.0–17.0)
MCH: 28.8 pg (ref 26.0–34.0)
MCHC: 33 g/dL (ref 30.0–36.0)
MCV: 87.4 fL (ref 78.0–100.0)
Platelets: 283 10*3/uL (ref 150–400)
RBC: 5.14 MIL/uL (ref 4.22–5.81)
RDW: 13.7 % (ref 11.5–15.5)
WBC: 10.7 10*3/uL — AB (ref 4.0–10.5)

## 2016-08-08 LAB — GLUCOSE, CAPILLARY: GLUCOSE-CAPILLARY: 166 mg/dL — AB (ref 65–99)

## 2016-08-08 MED ORDER — ROSUVASTATIN CALCIUM 40 MG PO TABS: 40.0000 mg | ORAL_TABLET | Freq: Every day | ORAL | 3 refills | Status: DC

## 2016-08-08 MED ORDER — ASPIRIN 81 MG PO CHEW: 81.0000 mg | CHEWABLE_TABLET | Freq: Every day | ORAL | Status: DC

## 2016-08-08 MED ORDER — TICAGRELOR 90 MG PO TABS: 90.0000 mg | ORAL_TABLET | Freq: Two times a day (BID) | ORAL | 0 refills | Status: DC

## 2016-08-08 MED ORDER — ATORVASTATIN CALCIUM 80 MG PO TABS: 80.0000 mg | ORAL_TABLET | Freq: Every day | ORAL | 3 refills | Status: DC

## 2016-08-08 MED ORDER — NITROGLYCERIN 0.4 MG SL SUBL: 0.4000 mg | SUBLINGUAL_TABLET | SUBLINGUAL | 1 refills | Status: DC | PRN

## 2016-08-08 MED ORDER — NITROGLYCERIN 0.4 MG SL SUBL: 0.4000 mg | SUBLINGUAL_TABLET | SUBLINGUAL | Status: DC | PRN

## 2016-08-08 MED ORDER — TICAGRELOR 90 MG PO TABS: 90.0000 mg | ORAL_TABLET | Freq: Two times a day (BID) | ORAL | 3 refills | Status: DC

## 2016-08-08 MED ORDER — LIVING WELL WITH DIABETES BOOK
Freq: Once | Status: AC
  Administered 2016-08-08: 11:00:00
  Filled 2016-08-08: qty 1

## 2016-08-08 MED ORDER — METOPROLOL SUCCINATE ER 25 MG PO TB24: 25.0000 mg | ORAL_TABLET | Freq: Every day | ORAL | 3 refills | Status: DC

## 2016-08-08 NOTE — Progress Notes (Signed)
Inpatient Diabetes Program Recommendations  AACE/ADA: New Consensus Statement on Inpatient Glycemic Control (2015)  Target Ranges:  Prepandial:   less than 140 mg/dL      Peak postprandial:   less than 180 mg/dL (1-2 hours)      Critically ill patients:  140 - 180 mg/dL   Lab Results  Component Value Date   GLUCAP 166 (H) 08/08/2016    Review of Glycemic Control  Inpatient Diabetes Program Recommendations:  Please consider A1c to determine prehospital glycemic control.  Thank you, Billy FischerJudy E. Tyhir Schwan, RN, MSN, CDE Inpatient Glycemic Control Team Team Pager [phone] (8am-5pm) 08/08/2016 8:11 AM

## 2016-08-08 NOTE — Discharge Summary (Signed)
Discharge Summary    Patient ID: Jackson Booth,  MRN: [id number], DOB/AGE: 09-Jun-1955 61 y.o.  Admit date: 08/04/2016 Discharge date: 08/08/2016   Primary Care Provider: Josue HectorNYLAND,LEONARD ROBERT Primary Cardiologist: New - Dr. Allyson SabalBerry  Discharge Diagnoses    Active Problems:   Coronary artery disease   Acute ST elevation myocardial infarction (STEMI) involving left circumflex coronary artery without development of Q waves (HCC)   HTN (hypertension)   HLD (hyperlipidemia)   DM (diabetes mellitus) (HCC)   Allergies Allergies  Allergen Reactions  . Carrot Oil Itching and Swelling    Tongue swells  . Pecan Nut (Diagnostic) Itching and Swelling    Tongue swells  . Tape Rash     History of Present Illness     Jackson Booth is a 61 yo male with PMH of HTN and DM. Reports he is followed by his PCP for his chronic medical conditions. Denies any hx of CAD. Does report strong family hx fof CAD with mother and brothers having CABG. Reports his younger brother had CABG in his mid 9140s. Did have a stress test many years ago that was normal. Reports he was in his usual state of health until this afternoon. He climbed a ladder to work on his roof, was up there for a short period of time when he developed a sudden onset of chest pain with radiation up into the left jaw. Symptoms persisted and he called EMS.   On EMS arrival he was in the bathroom, had a bowel movement. EKG showed SR with ST elevation in II, III, aVF, v5-6 with ST depression in leads v1, v2. He was given 324 ASA and 3 SL nitro with improvement in his pain to 4/10 on arrival to the hospital.   Hospital Course     Consultants: None  [facility] and was found to have an inferolateral STEMI. He was taken to the cath lab and DES was placed to his circumflex on 08/04/16. Echocardiogram on 08/05/16 with preserved ejection fraction (50-55%). LHC on 08/04/16 revealed 90% stenosis of the RCA. He returned to the cath lab on 08/07/16  and received a DES to the RCA. Both heart caths via right radial artery. Radial artery cath site without bleeding, with dressing in place. Pt expresses concern about contracting PNA. Provided IS on discharge and encouraged OOB at home during the day. Instructed him to ambulate at home as tolerated.  He will need DAPT x 12 months (ASA and Brilinta). He was discharged on new lipitor 80mg  and new Toprol 25mg .  He will continue his home ACEI.  Patient seen and examined by Dr. Anne FuSkains  today and was stable for discharge. All follow up has been arranged.  _____________  Discharge Vitals Blood pressure (!) 148/90, pulse 73, temperature 98.4 F (36.9 C), temperature source Oral, resp. rate 12, height 5\' 9"  (1.753 m), weight 190 lb 14.7 oz (86.6 kg), SpO2 97 %.  Filed Weights   08/04/16 1610 08/07/16 0600 08/08/16 0403  Weight: 189 lb 14.4 oz (86.1 kg) 180 lb 12.4 oz (82 kg) 190 lb 14.7 oz (86.6 kg)    Labs & Radiologic Studies    CBC  Recent Labs  08/08/16 0353  WBC 10.7*  HGB 14.8  HCT 44.9  MCV 87.4  PLT 283   Basic Metabolic Panel  Recent Labs  08/08/16 0353  NA 138  K 4.0  CL 105  CO2 23  GLUCOSE 145*  BUN 13  CREATININE  1.13  CALCIUM 8.6*   Liver Function Tests No results for input(s): AST, ALT, ALKPHOS, BILITOT, PROT, ALBUMIN in the last 72 hours. No results for input(s): LIPASE, AMYLASE in the last 72 hours. Cardiac Enzymes No results for input(s): CKTOTAL, CKMB, CKMBINDEX, TROPONINI in the last 72 hours. BNP Invalid input(s): POCBNP D-Dimer No results for input(s): DDIMER in the last 72 hours. Hemoglobin A1C No results for input(s): HGBA1C in the last 72 hours. Fasting Lipid Panel No results for input(s): CHOL, HDL, LDLCALC, TRIG, CHOLHDL, LDLDIRECT in the last 72 hours. Thyroid Function Tests No results for input(s): TSH, T4TOTAL, T3FREE, THYROIDAB in the last 72 hours.  Invalid input(s): FREET3 _____________  No results found.   Diagnostic  Studies/Procedures    Coronary Stent Intervention 08/07/16:  Ost Cx lesion, 40 %stenosed.  Mid Cx to Dist Cx lesion, 0 %stenosed.  Mid RCA lesion, 80 %stenosed.  Post intervention, there is a 0% residual stenosis.  A stent was successfully placed.  Successful mid dominant RCA PCI and drug-eluting stenting of a high-grade 80% stenosis performed in a staged fashion 3 days post circumflex intervention in the setting of an inferolateral STEMI the patient tolerated the procedure well.    Echocardiogram 08/05/16: Study Conclusions - Procedure narrative: Transthoracic echocardiography. Image quality was poor. The study was technically difficult, as a result of poor acoustic windows. Intravenous contrast (Definity) was administered. - Left ventricle: The cavity size was normal. Wall thickness was increased in a pattern of moderate LVH. Systolic function was normal. The estimated ejection fraction was in the range of 50% to 55%. Inferolateral wall hypokinesis. Diastolic dysfunction with indeterminate LV filling pressure. - Mitral valve: Mildly thickened leaflets . There was mild regurgitation. - Left atrium: The atrium was normal in size. - Inferior vena cava: The vessel was normal in size. The respirophasic diameter changes were in the normal range (= 50%), consistent with normal central venous pressure.  Impressions: - Technically difficult study. Definity contrast given. LVEF 50-55% with inferior and lateral wall hypokinesis, diastolic dysfunction and elevated LV filling pressure, normal LA size, normal IVC.   Left Heart Cath 08/04/16:  Mid RCA lesion, 90 %stenosed.  Ost Cx lesion, 40 %stenosed.  Ost 1st Diag to 1st Diag lesion, 90 %stenosed.  Mid Cx lesion, 100 %stenosed.  Post intervention, there is a 0% residual stenosis.  A stent was successfully placed.  There is mild left ventricular systolic dysfunction.  LV end diastolic pressure is  mildly elevated.  The left ventricular ejection fraction is 45-50% by visual estimate.  Successful PCI and drug-eluting stenting along with aspiration thrombectomy of an occluded nondominant but fairly large circumflex coronary artery with a door to balloon time of 21 minutes. Ejection fraction is approximately 50% with moderate posterior lateral hypokinesia. He does have residual high-grade mid dominant RCA stenosis as well as a high-grade proximal first diagonal branch stenosis.    Disposition   Pt is being discharged home today in good condition.  Follow-up Plans & Appointments    Follow-up Information    Azalee Course, [place] Follow up on 08/18/2016.   Specialties:  Cardiology, Radiology Why:  1:30 pm appt for [facility] / hospital F/U Contact information: [address] [place] [address] [phone]          Discharge Instructions    AMB Referral to Cardiac Rehabilitation - Phase II    Complete by:  As directed    Diagnosis:  STEMI   AMB Referral to Cardiac Rehabilitation - Phase  II    Complete by:  As directed    Diagnosis:  STEMI   Amb Referral to Cardiac Rehabilitation    Complete by:  As directed    Diagnosis:   Coronary Stents STEMI PTCA     Diet - low sodium heart healthy    Complete by:  As directed    Discharge instructions    Complete by:  As directed    PLEASE REMEMBER TO BRING ALL OF YOUR MEDICATIONS TO EACH OF YOUR FOLLOW-UP OFFICE VISITS.  PLEASE ATTEND ALL SCHEDULED FOLLOW-UP APPOINTMENTS.   Activity: Increase activity slowly as tolerated. You may shower, but no soaking baths (or swimming) for 1 week. You may not return to work until cleared by your cardiologist. No lifting over 10 lbs for 4 weeks. No sexual activity for 4 weeks.   Wound Care: You may wash cath site gently with soap and water. Keep cath site clean and dry. If you notice pain, swelling, bleeding or pus at your cath site, please call [phone].   Increase activity slowly     Complete by:  As directed       Discharge Medications   Current Discharge Medication List    START taking these medications   Details  aspirin 81 MG chewable tablet Chew 1 tablet (81 mg total) by mouth daily.    atorvastatin (LIPITOR) 80 MG tablet Take 1 tablet (80 mg total) by mouth daily at 6 PM. Qty: 90 tablet, Refills: 3    metoprolol succinate (TOPROL-XL) 25 MG 24 hr tablet Take 1 tablet (25 mg total) by mouth daily. Qty: 90 tablet, Refills: 3    nitroGLYCERIN (NITROSTAT) 0.4 MG SL tablet Place 1 tablet (0.4 mg total) under the tongue every 5 (five) minutes as needed for chest pain. Qty: 25 tablet, Refills: 1    !! ticagrelor (BRILINTA) 90 MG TABS tablet Take 1 tablet (90 mg total) by mouth 2 (two) times daily. Qty: 180 tablet, Refills: 3    !! ticagrelor (BRILINTA) 90 MG TABS tablet Take 1 tablet (90 mg total) by mouth 2 (two) times daily. Qty: 60 tablet, Refills: 0     !! - Potential duplicate medications found. Please discuss with provider.    CONTINUE these medications which have NOT CHANGED   Details  benazepril (LOTENSIN) 40 MG tablet Take 40 mg by mouth every evening.     diphenhydrAMINE (BENADRYL) 25 mg capsule Take 25 mg by mouth every 6 (six) hours as needed for allergies or sleep.    glipiZIDE (GLUCOTROL XL) 10 MG 24 hr tablet Take 10 mg by mouth every evening.    metFORMIN (GLUCOPHAGE) 1000 MG tablet Take 1,000 mg by mouth every evening.     OVER THE COUNTER MEDICATION Jet Alert Double Strength Caffeine 200 Mg Caplets: Take 0.5-1 caplet by mouth two times a day as needed for alertness      STOP taking these medications     aspirin-acetaminophen-caffeine (EXCEDRIN EXTRA STRENGTH) 250-250-65 MG tablet      pseudoephedrine-acetaminophen (TYLENOL SINUS) 30-500 MG TABS tablet          Aspirin prescribed at discharge?  Yes High Intensity Statin Prescribed? (Lipitor 40-80mg  or Crestor 20-40mg ): Yes Beta Blocker Prescribed? Yes For EF <40%, was ACEI/ARB  Prescribed? Yes ADP Receptor Inhibitor Prescribed? (i.e. Plavix etc.-Includes Medically Managed Patients): Yes For EF <40%, Aldosterone Inhibitor Prescribed? No: EF preserved Was EF assessed during THIS hospitalization? Yes Was Cardiac Rehab II ordered? (Included Medically managed Patients): Yes   Outstanding  Labs/Studies   None  Duration of Discharge Encounter   Greater than 30 minutes including physician time.  Signed, Roe Rutherford Duke PA-C 08/08/2016, 9:27 AM   Personally seen and examined. Agree with above.  61 year old male who presented with inferolateral ST elevation MI, received a circumflex DES and then subsequently underwent RCA PCI staged with DES as well as doing well. Radial artery site intact. Ambulating well. Education performed. High intensity statin, dual antiplatelet therapy instructed. Regular rate and rhythm, lungs are clear, alert. Ready for discharge. Close follow-up, transition clinic.  Donato Schultz, MD

## 2016-08-08 NOTE — Progress Notes (Signed)
Progress Note  Patient Name: Jackson Booth Date of Encounter: 08/08/2016  Primary Cardiologist: New - Dr. Allyson Booth  Subjective   Patient is feeling well; denies chest pain, SOB, and palpitations. He is worried about PNA, we discussed IS use at home.   Inpatient Medications    Scheduled Meds: . aspirin  81 mg Oral Daily  . atorvastatin  80 mg Oral q1800  . enoxaparin (LOVENOX) injection  40 mg Subcutaneous Q24H  . insulin aspart  0-15 Units Subcutaneous TID WC  . metoprolol succinate  25 mg Oral Daily  . sodium chloride flush  3 mL Intravenous Q12H  . sodium chloride flush  3 mL Intravenous Q12H  . ticagrelor  90 mg Oral BID   Continuous Infusions: . nitroGLYCERIN Stopped (08/06/16 0800)   PRN Meds: sodium chloride, sodium chloride, acetaminophen, morphine injection, ondansetron (ZOFRAN) IV, oxyCODONE-acetaminophen, sodium chloride flush, sodium chloride flush   Vital Signs    Vitals:   08/07/16 1503 08/07/16 1920 08/07/16 2000 08/08/16 0403  BP: 107/88 129/83  125/78  Pulse: 66 73  63  Resp: 14 20 17 12   Temp: 98 F (36.7 C) 97.7 F (36.5 C)  97.7 F (36.5 C)  TempSrc: Oral Oral  Oral  SpO2: 96% 97%  97%  Weight:    190 lb 14.7 oz (86.6 kg)  Height:        Intake/Output Summary (Last 24 hours) at 08/08/16 0732 Last data filed at 08/08/16 0700  Gross per 24 hour  Intake              810 ml  Output              600 ml  Net              210 ml   Filed Weights   08/04/16 1610 08/07/16 0600 08/08/16 0403  Weight: 189 lb 14.4 oz (86.1 kg) 180 lb 12.4 oz (82 kg) 190 lb 14.7 oz (86.6 kg)     Physical Exam   General: Well developed, well nourished, male appearing in no acute distress. Head: Normocephalic, atraumatic.  Neck: Supple without bruits, JVD. Lungs:  Resp regular and unlabored, CTA. Heart: RRR, S1, S2, no murmur; no rub. Abdomen: Soft, non-tender, non-distended with normoactive bowel sounds. No hepatomegaly. No rebound/guarding. No obvious abdominal  masses. Extremities: No clubbing, cyanosis, No edema. Distal pedal pulses are 1+ bilaterally. Neuro: Alert and oriented X 3. Moves all extremities spontaneously. Psych: Normal affect.  Labs    Chemistry Recent Labs Lab 08/04/16 1437 08/05/16 0342 08/08/16 0353  NA 145 138 138  K 3.8 3.5 4.0  CL 107 109 105  CO2  --  21* 23  GLUCOSE 279* 204* 145*  BUN 10 8 13   CREATININE 1.20 0.99 1.13  CALCIUM  --  8.5* 8.6*  GFRNONAA  --  >60 >60  GFRAA  --  >60 >60  ANIONGAP  --  8 10     Hematology Recent Labs Lab 08/04/16 1437 08/05/16 0342 08/08/16 0353  WBC  --  12.7* 10.7*  RBC  --  4.72 5.14  HGB 16.0 13.7 14.8  HCT 47.0 41.0 44.9  MCV  --  86.9 87.4  MCH  --  29.0 28.8  MCHC  --  33.4 33.0  RDW  --  13.6 13.7  PLT  --  286 283    Cardiac Enzymes Recent Labs Lab 08/04/16 1622 08/04/16 2140 08/05/16 0342  TROPONINI 5.72* 36.23* 31.05*   No  results for input(s): TROPIPOC in the last 168 hours.   BNPNo results for input(s): BNP, PROBNP in the last 168 hours.   DDimer No results for input(s): DDIMER in the last 168 hours.   Radiology    No results found.   Telemetry    NSR in the 70s - Personally Reviewed  ECG     08/08/16: NSR with TWI  In V5/V6   - Personally Reviewed   Cardiac Studies    Coronary Stent Intervention 08/07/16:  [name] lesion, 40 %stenosed.  Mid Cx to Dist Cx lesion, 0 %stenosed.  Mid RCA lesion, 80 %stenosed.  Post intervention, there is a 0% residual stenosis.  A stent was successfully placed.  Successful mid dominant RCA PCI and drug-eluting stenting of a high-grade 80% stenosis performed in a staged fashion 3 days post circumflex intervention in the setting of an inferolateral STEMI the patient tolerated the procedure well.    Echocardiogram 08/05/16: Study Conclusions - Procedure narrative: Transthoracic echocardiography. Image   quality was poor. The study was technically difficult, as a   result of poor acoustic  windows. Intravenous contrast (Definity)   was administered. - Left ventricle: The cavity size was normal. Wall thickness was   increased in a pattern of moderate LVH. Systolic function was   normal. The estimated ejection fraction was in the range of 50%   to 55%. Inferolateral wall hypokinesis. Diastolic dysfunction   with indeterminate LV filling pressure. - Mitral valve: Mildly thickened leaflets . There was mild   regurgitation. - Left atrium: The atrium was normal in size. - Inferior vena cava: The vessel was normal in size. The   respirophasic diameter changes were in the normal range (= 50%),   consistent with normal central venous pressure.  Impressions: - Technically difficult study. Definity contrast given. LVEF 50-55%   with inferior and lateral wall hypokinesis, diastolic dysfunction   and elevated LV filling pressure, normal LA size, normal IVC.   Left Heart Cath 08/04/16:  Mid RCA lesion, 90 %stenosed.  Ost Cx lesion, 40 %stenosed.  Ost 1st Diag to 1st Diag lesion, 90 %stenosed.  Mid Cx lesion, 100 %stenosed.  Post intervention, there is a 0% residual stenosis.  A stent was successfully placed.  There is mild left ventricular systolic dysfunction.  LV end diastolic pressure is mildly elevated.  The left ventricular ejection fraction is 45-50% by visual estimate.  Successful PCI and drug-eluting stenting along with aspiration thrombectomy of an occluded nondominant but fairly large circumflex coronary artery with a door to balloon time of 21 minutes. Ejection fraction is approximately 50% with moderate posterior lateral hypokinesia. He does have residual high-grade mid dominant RCA stenosis as well as a high-grade proximal first diagonal branch stenosis.    Patient Profile     61 y.o. male with inferolateral STEMI (circ stent).  Assessment & Plan    1. Chest pain - Pt presented to ED with inferolateral STEMI - he was taken emergently to the cath lab  and received a DES to circumflex; heart cath also showed RCA 90% occlusion - he returned to the cath lab for PCI to his RCA (DES) on 08/07/16  - he tolerated both procedures well. Echocardiogram on 08/06/16 without signs of heart failure - right radial cath site without bleeding or signs of infection   2. DM - resume home oral medications   3. HTN - instructed him to check BP daily - new medications: Toprol - resume ACEI   4.  HLD - prescribed high potency statin - 80mg  lipitor   Signed, Marcelino Duster , PA-C 7:32 AM 08/08/2016 Pager: [phone]  Personally seen and examined. Agree with above.  61 year old male who presented with inferolateral ST elevation MI, received a circumflex DES and then subsequently underwent RCA PCI staged with DES as well as doing well. Radial artery site intact. Ambulating well. Education performed. High intensity statin, dual antiplatelet therapy instructed. Regular rate and rhythm, lungs are clear, alert. Ready for discharge. Close follow-up, transition clinic.  Donato Schultz, MD

## 2016-08-08 NOTE — Progress Notes (Signed)
CARDIAC REHAB PHASE I   PRE:  Rate/Rhythm: 80 SR    BP: sitting 145/81    SaO2:   MODE:  Ambulation: 1000 ft   POST:  Rate/Rhythm: 92 SR    BP: sitting 164/963     SaO2:   Stiff walking, no other c/o. BP elevated after walk. Ed completed. Reinforced importance of meds, esp Brilinta. Pt doesn't take DM and HTN meds exactly like prescribed. Encouraged more ex and to work on diet control. Will refer to [place] CRPII. Pt fairly receptive. Set up stent video. [id number]  Jackson Booth CES, ACSM 08/08/2016 9:06 AM

## 2016-08-11 ENCOUNTER — Telehealth (HOSPITAL_COMMUNITY): Payer: Self-pay | Admitting: Family Medicine

## 2016-08-11 NOTE — Telephone Encounter (Signed)
Verified [employer] benefits through Passport. No Co-Pay, Co-Insurance 10%, Deductible $350.00 pt has met all of Deductible Out of Pocket $6000.00, pt has met $843.69, pt's responsibility [id number]. Reference [phone]... KJ

## 2016-08-18 ENCOUNTER — Ambulatory Visit (INDEPENDENT_AMBULATORY_CARE_PROVIDER_SITE_OTHER): Payer: Managed Care, Other (non HMO) | Admitting: Physician Assistant

## 2016-08-18 ENCOUNTER — Encounter: Payer: Self-pay | Admitting: *Deleted

## 2016-08-18 ENCOUNTER — Encounter: Payer: Self-pay | Admitting: Physician Assistant

## 2016-08-18 VITALS — BP 140/85 | HR 61 | Ht 69.0 in | Wt 188.0 lb

## 2016-08-18 DIAGNOSIS — E118 Type 2 diabetes mellitus with unspecified complications: Secondary | ICD-10-CM | POA: Diagnosis not present

## 2016-08-18 DIAGNOSIS — I2510 Atherosclerotic heart disease of native coronary artery without angina pectoris: Secondary | ICD-10-CM | POA: Diagnosis not present

## 2016-08-18 DIAGNOSIS — I7090 Unspecified atherosclerosis: Secondary | ICD-10-CM

## 2016-08-18 DIAGNOSIS — I10 Essential (primary) hypertension: Secondary | ICD-10-CM

## 2016-08-18 NOTE — Patient Instructions (Signed)
Medication Instructions:  Your physician recommends that you continue on your current medications as directed. Please refer to the Current Medication list given to you today.   Labwork: IN 6-8 WEEKS YOU WILL NEED TO HAVE FASTING LIPID AND LIVER PANEL DONE; I HAVE PLACED THE ORDER TO BE DONE AT [facility] AT THE [facility] OFFICE. THE LAB IS ON THE FIRST FLOOR  Testing/Procedures: NONE  Follow-Up: DR. Allyson SabalBERRY IN 2-3 MONTHS   Any Other Special Instructions Will Be Listed Below (If Applicable).     If you need a refill on your cardiac medications before your next appointment, please call your pharmacy.

## 2016-08-18 NOTE — Progress Notes (Signed)
Cardiology Office Note    Date:  08/19/2016   ID:  Jackson Booth, DOB 1955-10-23, MRN [id number]  PCP:  Josue Hector, MD  Cardiologist:  Dr. Allyson Sabal  Chief Complaint  Patient presents with  . Follow-up    seen for Dr. Allyson Sabal    History of Present Illness:  Jackson Booth is a 61 y.o. male with PMH of HTN, DM II, h/o vertigo and CAD. He was recently diagnosed with CAD after presenting with inferolateral STEMI. He has strong family history of CAD with his younger brother having MI in his mid 33s. Initial EKG on arrival showed ST elevation in lead 2, 3, aVF, V5 and V6 with ST depression in lead V1 and V2. He underwent cardiac catheterization on 08/04/2016 which showed 40% ostial left circumflex lesion, 90% mid RCA lesion, high-grade 90% diagonal lesion, occluded nondominant but fairly large left circumflex treated with DES, door to balloon time was 21 minutes. During cardiac catheterization, EF was approximately 50% with moderate posterior lateral hypokinesia. Echocardiogram obtained on 08/05/2016 showed EF 50-55%, technically difficult study with poor caustic window despite Definity, inferolateral wall hypokinesis. His RCA lesion was fixed in a staged fashion. Interestingly, cardiac cath report on stage PCI did not mention the high-grade diagonal lesion. He has been placed on high-dose statin with 40 mg Crestor, aspirin, metoprolol succinate, and Brilinta.  He has been doing well since the hospital, he has not had any further chest discomfort. He has been compliant with aspirin and Brilinta. He denies any shortness of breath or lower extremity edema. EKG today does show more T-wave inversion in the inferior lead. However given the lack of chest discomfort, I would not recommend any additional ischemic workup at this time. I did fill out his FMLA form for him to go back to work on 09/03/2016. I encouraged him to start on cardiac rehabilitation.   Past Medical History:  Diagnosis Date  . Anginal pain  (HCC)    relaged to "stress"  . Anxiety   . Arthritis    "pretty much all over" (08/07/2016)  . CAD S/P percutaneous coronary angioplasty 08/07/2016   DES to Cx on 08/04/16; DES to RCA on 08/07/16  . Family history of adverse reaction to anesthesia    "Mom was allergic to something they don't use anymore"  . High cholesterol   . History of vertigo   . Hypertension   . Inferolateral myocardial infarction (HCC) 08/04/2016   inferolateral ST segment elevation myocardial infarction/notes 08/04/2016; DES to Cx  . Sinus headache   . Type II diabetes mellitus (HCC)     Past Surgical History:  Procedure Laterality Date  . CARPAL TUNNEL RELEASE Right 1986  . CORONARY ANGIOPLASTY WITH STENT PLACEMENT  ; 08/04/2016; 08/07/2016  . CORONARY STENT INTERVENTION N/A 08/04/2016   Procedure: Coronary Stent Intervention;  Surgeon: Runell Gess, MD;  Location: [facility] INVASIVE CV LAB;  Service: Cardiovascular;  Laterality: N/A;  . CORONARY STENT INTERVENTION N/A 08/07/2016   Procedure: Coronary Stent Intervention;  Surgeon: Runell Gess, MD;  Location: [facility] INVASIVE CV LAB;  Service: Cardiovascular;  Laterality: N/A;  . INGUINAL HERNIA REPAIR Right 1986  . LEFT HEART CATH AND CORONARY ANGIOGRAPHY N/A 08/04/2016   Procedure: Left Heart Cath and Coronary Angiography;  Surgeon: Runell Gess, MD;  Location: [facility] INVASIVE CV LAB;  Service: Cardiovascular;  Laterality: N/A;  . NASAL SEPTUM SURGERY  ~ 1975; 1977    Current Medications: Outpatient Medications Prior to Visit  Medication Sig Dispense  Refill  . aspirin 81 MG chewable tablet Chew 1 tablet (81 mg total) by mouth daily.    . benazepril (LOTENSIN) 40 MG tablet Take 40 mg by mouth every evening.     . diphenhydrAMINE (BENADRYL) 25 mg capsule Take 25 mg by mouth every 6 (six) hours as needed for allergies or sleep.    Marland Kitchen glipiZIDE (GLUCOTROL XL) 10 MG 24 hr tablet Take 10 mg by mouth every evening.    . metFORMIN (GLUCOPHAGE) 1000 MG tablet Take 1,000 mg by  mouth every evening.     . metoprolol succinate (TOPROL-XL) 25 MG 24 hr tablet Take 1 tablet (25 mg total) by mouth daily. 90 tablet 3  . nitroGLYCERIN (NITROSTAT) 0.4 MG SL tablet Place 1 tablet (0.4 mg total) under the tongue every 5 (five) minutes as needed for chest pain. 25 tablet 1  . OVER THE COUNTER MEDICATION Jet Alert Double Strength Caffeine 200 Mg Caplets: Take 0.5-1 caplet by mouth two times a day as needed for alertness    . rosuvastatin (CRESTOR) 40 MG tablet Take 1 tablet (40 mg total) by mouth daily. 90 tablet 3  . ticagrelor (BRILINTA) 90 MG TABS tablet Take 1 tablet (90 mg total) by mouth 2 (two) times daily. 180 tablet 3  . ticagrelor (BRILINTA) 90 MG TABS tablet Take 1 tablet (90 mg total) by mouth 2 (two) times daily. 60 tablet 0   No facility-administered medications prior to visit.      Allergies:   Carrot [daucus carota]; Pecan nut (diagnostic); and Tape   Social History   Social History  . Marital status: Single    Spouse name: N/A  . Number of children: N/A  . Years of education: N/A   Social History Main Topics  . Smoking status: Never Smoker  . Smokeless tobacco: Never Used  . Alcohol use No  . Drug use: No  . Sexual activity: No   Other Topics Concern  . None   Social History Narrative  . None     Family History:  The patient's family history includes Heart disease in his brother and mother; Hypertension in his father and mother.   ROS:   Please see the history of present illness.    ROS All other systems reviewed and are negative.   PHYSICAL EXAM:   VS:  BP 140/85 (BP Location: Right Arm, Patient Position: Sitting, Cuff Size: Normal)   Pulse 61   Ht 5\' 9"  (1.753 m)   Wt 188 lb (85.3 kg)   BMI 27.76 kg/m    GEN: Well nourished, well developed, in no acute distress  HEENT: normal  Neck: no JVD, carotid bruits, or masses Cardiac: RRR; no murmurs, rubs, or gallops,no edema  Respiratory:  clear to auscultation bilaterally, normal work of  breathing GI: soft, nontender, nondistended, + BS MS: no deformity or atrophy  Skin: warm and dry, no rash Neuro:  Alert and Oriented x 3, Strength and sensation are intact Psych: euthymic mood, full affect  Wt Readings from Last 3 Encounters:  08/18/16 188 lb (85.3 kg)  08/08/16 190 lb 14.7 oz (86.6 kg)      Studies/Labs Reviewed:   EKG:  EKG is ordered today.  The ekg ordered today demonstrates Normal sinus rhythm with T-wave inversion in lead 2, 3, aVF, V3 through V6 and lead 1 and aVL  Recent Labs: 08/08/2016: BUN 13; Creatinine, Ser 1.13; Hemoglobin 14.8; Platelets 283; Potassium 4.0; Sodium 138   Lipid Panel No results found  for: CHOL, TRIG, HDL, CHOLHDL, VLDL, LDLCALC, LDLDIRECT  Additional studies/ records that were reviewed today include:   Cath 08/04/2016 Conclusion     Mid RCA lesion, 90 %stenosed.  Ost Cx lesion, 40 %stenosed.  Ost 1st Diag to 1st Diag lesion, 90 %stenosed.  Mid Cx lesion, 100 %stenosed.  Post intervention, there is a 0% residual stenosis.  A stent was successfully placed.  There is mild left ventricular systolic dysfunction.  LV end diastolic pressure is mildly elevated.  The left ventricular ejection fraction is 45-50% by visual estimate.        Echo 08/05/2016 LV EF: 50% -   55%  - Procedure narrative: Transthoracic echocardiography. Image   quality was poor. The study was technically difficult, as a   result of poor acoustic windows. Intravenous contrast (Definity)   was administered. - Left ventricle: The cavity size was normal. Wall thickness was   increased in a pattern of moderate LVH. Systolic function was   normal. The estimated ejection fraction was in the range of 50%   to 55%. Inferolateral wall hypokinesis. Diastolic dysfunction   with indeterminate LV filling pressure. - Mitral valve: Mildly thickened leaflets . There was mild   regurgitation. - Left atrium: The atrium was normal in size. - Inferior vena cava:  The vessel was normal in size. The   respirophasic diameter changes were in the normal range (= 50%),   consistent with normal central venous pressure.  Impressions:  - Technically difficult study. Definity contrast given. LVEF 50-55%   with inferior and lateral wall hypokinesis, diastolic dysfunction   and elevated LV filling pressure, normal LA size, normal IVC.    Cath 08/07/2016 Conclusion     Ost Cx lesion, 40 %stenosed.  Mid Cx to Dist Cx lesion, 0 %stenosed.  Mid RCA lesion, 80 %stenosed.  Post intervention, there is a 0% residual stenosis.  A stent was successfully placed.      ASSESSMENT:    1. Coronary artery disease involving native coronary artery of native heart without angina pectoris   2. Atherosclerosis   3. Essential hypertension   4. Controlled type 2 diabetes mellitus with complication, without long-term current use of insulin (HCC)      PLAN:  In order of problems listed above:  1. CAD s/p PCI to RCA and left circumflex: Recently underwent staged PCI to both RCA and left circumflex after presented with inferolateral STEMI. He is troponin went up to 35. Fortunately echocardiogram showed his ejection fraction is close to normal. We will continue on aspirin and Brilinta, emphasized repeatedly the need to be compliant with poor antiplatelet therapy. Given significant atherosclerosis seen on cath, will obtain outpatient fasting lipid panel and LFT in 6-8 weeks. I did not see a lipid panel being done in the hospital.  2. Hypertension: His blood pressure is mildly elevated today, however he did not take his metoprolol this morning, I will hold off on adjusting his medication as it seems his blood pressure is quite well-controlled the day of discharge on his current regimen of blood pressure medications.  3. DM 2: We'll defer to his primary care physician, I did discuss with the patient the need to better control the risk factors.    Medication  Adjustments/Labs and Tests Ordered: Current medicines are reviewed at length with the patient today.  Concerns regarding medicines are outlined above.  Medication changes, Labs and Tests ordered today are listed in the Patient Instructions below. Patient Instructions  Medication Instructions:  Your physician recommends that you continue on your current medications as directed. Please refer to the Current Medication list given to you today.   Labwork: IN 6-8 WEEKS YOU WILL NEED TO HAVE FASTING LIPID AND LIVER PANEL DONE; I HAVE PLACED THE ORDER TO BE DONE AT [facility] AT THE [facility] OFFICE. THE LAB IS ON THE FIRST FLOOR  Testing/Procedures: NONE  Follow-Up: DR. Allyson Sabal IN 2-3 MONTHS   Any Other Special Instructions Will Be Listed Below (If Applicable).     If you need a refill on your cardiac medications before your next appointment, please call your pharmacy.      Ramond Dial, [place]  08/19/2016 1:51 AM    [facility] [address] [place], [place], [place]  [address] Phone: [phone]; Fax: [phone]

## 2016-08-19 ENCOUNTER — Encounter: Payer: Self-pay | Admitting: Physician Assistant

## 2016-09-03 ENCOUNTER — Telehealth: Payer: Self-pay | Admitting: Physician Assistant

## 2016-09-03 NOTE — Telephone Encounter (Signed)
Letter already in system - I have faxed to recipient. Called to verify receipt, unable to be transferred to caller after extended hold.

## 2016-09-03 NOTE — Telephone Encounter (Signed)
Pt needs a letter stating that he is able to return to work today. Pt is at work waiting on this letter, please fax this asap to [phone]. [id number] Mayford Knife

## 2016-09-03 NOTE — Telephone Encounter (Signed)
Letter faxed.

## 2016-09-29 ENCOUNTER — Telehealth (HOSPITAL_COMMUNITY): Payer: Self-pay | Admitting: Family Medicine

## 2016-09-29 ENCOUNTER — Other Ambulatory Visit: Payer: Self-pay | Admitting: Physician Assistant

## 2016-09-29 LAB — LIPID PANEL
CHOL/HDL RATIO: 2 ratio (ref <5.0)
CHOLESTEROL: 120 mg/dL (ref <200)
HDL: 59 mg/dL (ref >40)
LDL Cholesterol: 52 mg/dL (ref <100)
Triglycerides: 45 mg/dL (ref <150)
VLDL: 9 mg/dL (ref <30)

## 2016-09-29 LAB — HEPATIC FUNCTION PANEL
ALBUMIN: 4.1 g/dL (ref 3.6–5.1)
ALT: 21 U/L (ref 9–46)
AST: 17 U/L (ref 10–35)
Alkaline Phosphatase: 98 U/L (ref 40–115)
BILIRUBIN DIRECT: 0.1 mg/dL (ref <=0.2)
BILIRUBIN TOTAL: 0.4 mg/dL (ref 0.2–1.2)
Indirect Bilirubin: 0.3 mg/dL (ref 0.2–1.2)
Total Protein: 6.7 g/dL (ref 6.1–8.1)

## 2016-09-30 ENCOUNTER — Telehealth: Payer: Self-pay | Admitting: *Deleted

## 2016-09-30 NOTE — Telephone Encounter (Signed)
Spoke to patient and commended him on great looking cholesterol results. Pt voiced concern that he's been having joint pain for a few days. He doesn't think it's the medication as he notes problems w joints before. I informed him often if we think this is related to a statin SE we can stop it and see if symptoms improve. He's aware he has this option. We discussed and he decided he'll wait it out a few days to see if things get better. Advised him when he wishes to, to call the office for further recommendations. Pt voiced understanding and thanks.

## 2016-09-30 NOTE — Telephone Encounter (Signed)
-----   Message from Ok Anis, NP sent at 09/30/2016  2:34 PM EDT ----- Lipids look good - LDL 52.  lft's wnl.  Cont rosuvastatin @ current dose.

## 2016-10-21 ENCOUNTER — Telehealth (HOSPITAL_COMMUNITY): Payer: Self-pay

## 2016-10-28 ENCOUNTER — Encounter: Payer: Self-pay | Admitting: Cardiovascular Disease

## 2016-10-28 ENCOUNTER — Ambulatory Visit (INDEPENDENT_AMBULATORY_CARE_PROVIDER_SITE_OTHER): Payer: Managed Care, Other (non HMO) | Admitting: Cardiovascular Disease

## 2016-10-28 DIAGNOSIS — I2510 Atherosclerotic heart disease of native coronary artery without angina pectoris: Secondary | ICD-10-CM

## 2016-10-28 DIAGNOSIS — E7800 Pure hypercholesterolemia, unspecified: Secondary | ICD-10-CM

## 2016-10-28 DIAGNOSIS — I10 Essential (primary) hypertension: Secondary | ICD-10-CM

## 2016-10-28 NOTE — Assessment & Plan Note (Signed)
History of CAD status post acute inferolateral wall myocardial infarction 08/04/16 treated with PCI and drug-eluting stenting of the mid AV groove circumflex. 3 days later I performed RCA stenting through the radial approach. His EF is preserved. He is on dual antiplatelet therapy. He denies chest pain or shortness of breath.

## 2016-10-28 NOTE — Patient Instructions (Signed)
Medication Instructions: Your physician recommends that you continue on your current medications as directed. Please refer to the Current Medication list given to you today.   Follow-Up: We request that you follow-up in: 6 months with Hao Meng, PA and in 12 months with Dr Berry  You will receive a reminder letter in the mail two months in advance. If you don't receive a letter, please call our office to schedule the follow-up appointment.  If you need a refill on your cardiac medications before your next appointment, please call your pharmacy.  

## 2016-10-28 NOTE — Assessment & Plan Note (Signed)
History of essential hypertension blood pressure measured 114/72. He is on Lotensin and metoprolol. Continue current meds at current dosing

## 2016-10-28 NOTE — Progress Notes (Signed)
10/28/2016 Jackson Booth   Sep 25, 1955  [id number]  Primary Physician Joette Catching, MD Primary Cardiologist: Jackson Gess MD [name]  HPI:  Jackson Booth is a 61 year old mildly overweight single Caucasian male who lives in [place]. His risk factors include treated hypertension and diabetes. He has a strong family history of heart disease. He's had no prior cardiac history. He developed substernal chest pain relating to his jaw and left upper extremity at approximately noon today. He called EMS. His initial EKG showed ST segment elevation inferolaterally. He is admitted with inferolateral ST segment elevation and was brought urgently to the cardiac catheterization laboratory where radial cath revealed an occluded nondominant but fairly large circumflex, high-grade proximal moderate size diagonal branch disease and 80% mid to distal dominant RCA stenosis and a posterolateral wall motion abnormality. He underwent PCI and drug-eluting stenting of his circumflex coronary artery with a balloon time of 21 minutes. His peak troponin was 36. His ejection fraction was 50-55% by 2-D echo. He underwent staged mid dominant RCA PCI and drug-eluting stenting 3 days later and he was discharged home on 08/08/16. He started well since. He remains on dual antibiotic therapy and was asymptomatic. His most recent lipid profile performed 09/29/16 revealed total cholesterol 120, LDL 52 and HDL of 59.    Current Outpatient Prescriptions  Medication Sig Dispense Refill  . aspirin 81 MG chewable tablet Chew 1 tablet (81 mg total) by mouth daily.    . benazepril (LOTENSIN) 40 MG tablet Take 40 mg by mouth every evening.     . diphenhydrAMINE (BENADRYL) 25 mg capsule Take 25 mg by mouth every 6 (six) hours as needed for allergies or sleep.    Marland Kitchen glipiZIDE (GLUCOTROL XL) 10 MG 24 hr tablet Take 10 mg by mouth 2 (two) times daily.     . metFORMIN (GLUCOPHAGE) 1000 MG tablet Take 1,000 mg by mouth 2  (two) times daily with a meal.     . metoprolol succinate (TOPROL-XL) 25 MG 24 hr tablet Take 1 tablet (25 mg total) by mouth daily. 90 tablet 3  . nitroGLYCERIN (NITROSTAT) 0.4 MG SL tablet Place 1 tablet (0.4 mg total) under the tongue every 5 (five) minutes as needed for chest pain. 25 tablet 1  . OVER THE COUNTER MEDICATION Jet Alert Double Strength Caffeine 200 Mg Caplets: Take 0.5-1 caplet by mouth two times a day as needed for alertness    . rosuvastatin (CRESTOR) 40 MG tablet Take 1 tablet (40 mg total) by mouth daily. 90 tablet 3  . ticagrelor (BRILINTA) 90 MG TABS tablet Take 1 tablet (90 mg total) by mouth 2 (two) times daily. 180 tablet 3   No current facility-administered medications for this visit.     Allergies  Allergen Reactions  . Carrot [Daucus Carota] Swelling    PT IS ALLERGIC TO RAW CARROTS   . Pecan Nut (Diagnostic) Itching and Swelling    Tongue swells  . Tape Rash    Social History   Social History  . Marital status: Single    Spouse name: N/A  . Number of children: N/A  . Years of education: N/A   Occupational History  . Not on file.   Social History Main Topics  . Smoking status: Never Smoker  . Smokeless tobacco: Never Used  . Alcohol use No  . Drug use: No  . Sexual activity: No   Other Topics Concern  . Not on file   Social  History Narrative  . No narrative on file     Review of Systems: General: negative for chills, fever, night sweats or weight changes.  Cardiovascular: negative for chest pain, dyspnea on exertion, edema, orthopnea, palpitations, paroxysmal nocturnal dyspnea or shortness of breath Dermatological: negative for rash Respiratory: negative for cough or wheezing Urologic: negative for hematuria Abdominal: negative for nausea, vomiting, diarrhea, bright red blood per rectum, melena, or hematemesis Neurologic: negative for visual changes, syncope, or dizziness All other systems reviewed and are otherwise negative except as  noted above.    Blood pressure 114/72, pulse 75, height 5\' 9"  (1.753 m), weight 183 lb 9.6 oz (83.3 kg), SpO2 97 %.  General appearance: alert and no distress Neck: no adenopathy, no carotid bruit, no JVD, supple, symmetrical, trachea midline and thyroid not enlarged, symmetric, no tenderness/mass/nodules Lungs: clear to auscultation bilaterally Heart: regular rate and rhythm, S1, S2 normal, no murmur, click, rub or gallop Extremities: extremities normal, atraumatic, no cyanosis or edema  EKG not performed today  ASSESSMENT AND PLAN:   Coronary artery disease History of CAD status post acute inferolateral wall myocardial infarction 08/04/16 treated with PCI and drug-eluting stenting of the mid AV groove circumflex. 3 days later I performed RCA stenting through the radial approach. His EF is preserved. He is on dual antiplatelet therapy. He denies chest pain or shortness of breath.  HTN (hypertension) History of essential hypertension blood pressure measured 114/72. He is on Lotensin and metoprolol. Continue current meds at current dosing  HLD (hyperlipidemia) History of hyperlipidemia on statin therapy with recent lipid profile performed 09/29/16 revealing total cholesterol 120, LDL 52 and HDL of 59.      Jackson GessJonathan J. Iam Lipson MD FACP,FACC,FAHA, [facility] 10/28/2016 7:58 AM

## 2016-10-28 NOTE — Assessment & Plan Note (Signed)
History of hyperlipidemia on statin therapy with recent lipid profile performed 09/29/16 revealing total cholesterol 120, LDL 52 and HDL of 59.

## 2016-10-31 ENCOUNTER — Encounter (HOSPITAL_COMMUNITY): Payer: Self-pay | Admitting: Family Medicine

## 2016-10-31 NOTE — Progress Notes (Signed)
Mailed letter with our CRP for pt to call if interested in our program.... KJ  °

## 2017-05-15 ENCOUNTER — Other Ambulatory Visit: Payer: Self-pay | Admitting: Cardiovascular Disease

## 2017-12-01 ENCOUNTER — Ambulatory Visit: Payer: 59 | Admitting: Cardiovascular Disease

## 2017-12-01 ENCOUNTER — Encounter: Payer: Self-pay | Admitting: Cardiovascular Disease

## 2017-12-01 VITALS — BP 122/64 | HR 78 | Ht 68.0 in | Wt 188.0 lb

## 2017-12-01 DIAGNOSIS — I10 Essential (primary) hypertension: Secondary | ICD-10-CM | POA: Diagnosis not present

## 2017-12-01 DIAGNOSIS — I2510 Atherosclerotic heart disease of native coronary artery without angina pectoris: Secondary | ICD-10-CM

## 2017-12-01 DIAGNOSIS — I2121 ST elevation (STEMI) myocardial infarction involving left circumflex coronary artery: Secondary | ICD-10-CM

## 2017-12-01 DIAGNOSIS — E7800 Pure hypercholesterolemia, unspecified: Secondary | ICD-10-CM | POA: Diagnosis not present

## 2017-12-01 DIAGNOSIS — Z5181 Encounter for therapeutic drug level monitoring: Secondary | ICD-10-CM

## 2017-12-01 DIAGNOSIS — Z7902 Long term (current) use of antithrombotics/antiplatelets: Secondary | ICD-10-CM

## 2017-12-01 MED ORDER — BENAZEPRIL HCL 40 MG PO TABS: 40.0000 mg | ORAL_TABLET | Freq: Every evening | ORAL | 3 refills | Status: DC

## 2017-12-01 MED ORDER — ROSUVASTATIN CALCIUM 40 MG PO TABS: 40.0000 mg | ORAL_TABLET | Freq: Every day | ORAL | 3 refills | Status: DC

## 2017-12-01 MED ORDER — METOPROLOL SUCCINATE ER 25 MG PO TB24: 25.0000 mg | ORAL_TABLET | Freq: Every day | ORAL | 3 refills | Status: DC

## 2017-12-01 MED ORDER — CLOPIDOGREL BISULFATE 75 MG PO TABS: 75.0000 mg | ORAL_TABLET | Freq: Every day | ORAL | 3 refills | Status: DC

## 2017-12-01 NOTE — Patient Instructions (Signed)
Medication Instructions: STOP Brilinta  START Plavix (Clopidogrel) 75 mg twice daily.   Labwork: Your physician recommends that you return for a FASTING lipid profile and hepatic function panel at your earliest convenience.  Your physician recommends that you return for lab work in: 2 weeks after starting Plav[facility]. You will need to stop at registration.    Follow-Up: Your physician wants you to follow-up in: 1 year with Dr. Allyson SabalBerry. You will receive a reminder letter in the mail two months in advance. If you don't receive a letter, please call our office to schedule the follow-up appointment.  If you need a refill on your cardiac medications before your next appointment, please call your pharmacy.

## 2017-12-01 NOTE — Progress Notes (Signed)
12/01/2017 Jackson OldenBobby Maney   1956-01-16  [id number]  Primary Physician Joette CatchingNyland, Leonard, MD Primary Cardiologist: Runell GessJonathan J Clare Fennimore MD Milagros LollFACP, FACC, [place], [place]  HPI:  Jackson Booth is a 62 y.o.  mildly overweight single Caucasian male who [place]. His risk factors include treated hypertension and diabetes. He has a strong family history of heart disease. He's had no prior cardiac history.  I last saw him in the office 10/28/2016.  He developed substernal chest pain relating to his jaw and left upper extremity at approximately noon today. He called EMS. His initial EKG showed ST segment elevation inferolaterally. He is admitted with inferolateral ST segment elevation and was brought urgently to the cardiac catheterization laboratory where radial cath revealed an occluded nondominant but fairly large circumflex, high-grade proximal moderate size diagonal branch disease and 80% mid to distal dominant RCA stenosis and a posterolateral wall motion abnormality. He underwent PCI and drug-eluting stenting of his circumflex coronary artery with a balloon time of 21 minutes. His peak troponin was 36. His ejection fraction was 50-55% by 2-D echo. He underwent staged mid dominant RCA PCI and drug-eluting stenting 3 days later and he was discharged home on 08/08/16. He started well since. He remains on dual antibiotic therapy and was asymptomatic. His most recent lipid profile performed 09/29/16 revealed total cholesterol 120, LDL 52 and HDL of 59.  Since I saw him a year ago he has run out of the majority of his medications and has not refilled them although he is remained asymptomatic.   Current Meds  Medication Sig  . aspirin 81 MG chewable tablet Chew 1 tablet (81 mg total) by mouth daily.  . benazepril (LOTENSIN) 40 MG tablet Take 1 tablet (40 mg total) by mouth every evening.  . diphenhydrAMINE (BENADRYL) 25 mg capsule Take 25 mg by mouth every 6 (six) hours as needed for allergies or sleep.  .  metoprolol succinate (TOPROL-XL) 25 MG 24 hr tablet Take 1 tablet (25 mg total) by mouth daily.  . nitroGLYCERIN (NITROSTAT) 0.4 MG SL tablet Place 1 tablet (0.4 mg total) under the tongue every 5 (five) minutes as needed for chest pain.  Marland Kitchen. OVER THE COUNTER MEDICATION Jet Alert Double Strength Caffeine 200 Mg Caplets: Take 0.5-1 caplet by mouth two times a day as needed for alertness  . rosuvastatin (CRESTOR) 40 MG tablet Take 1 tablet (40 mg total) by mouth daily.  . [DISCONTINUED] benazepril (LOTENSIN) 40 MG tablet Take 40 mg by mouth every evening.   . [DISCONTINUED] BRILINTA 90 MG TABS tablet TAKE 1 TABLET (90 MG TOTAL) BY MOUTH 2 (TWO) TIMES DAILY.  . [DISCONTINUED] metoprolol succinate (TOPROL-XL) 25 MG 24 hr tablet Take 1 tablet (25 mg total) by mouth daily.  . [DISCONTINUED] rosuvastatin (CRESTOR) 40 MG tablet Take 1 tablet (40 mg total) by mouth daily.     Allergies  Allergen Reactions  . Carrot [Daucus Carota] Swelling    PT IS ALLERGIC TO RAW CARROTS   . Pecan Nut (Diagnostic) Itching and Swelling    Tongue swells  . Tape Rash    Social History   Socioeconomic History  . Marital status: Single    Spouse name: Not on file  . Number of children: Not on file  . Years of education: Not on file  . Highest education level: Not on file  Occupational History  . Not on file  Social Needs  . Financial resource strain: Not on file  . Food insecurity:  Worry: Not on file    Inability: Not on file  . Transportation needs:    Medical: Not on file    Non-medical: Not on file  Tobacco Use  . Smoking status: Never Smoker  . Smokeless tobacco: Never Used  Substance and Sexual Activity  . Alcohol use: No  . Drug use: No  . Sexual activity: Never  Lifestyle  . Physical activity:    Days per week: Not on file    Minutes per session: Not on file  . Stress: Not on file  Relationships  . Social connections:    Talks on phone: Not on file    Gets together: Not on file     Attends religious service: Not on file    Active member of club or organization: Not on file    Attends meetings of clubs or organizations: Not on file    Relationship status: Not on file  . Intimate partner violence:    Fear of current or ex partner: Not on file    Emotionally abused: Not on file    Physically abused: Not on file    Forced sexual activity: Not on file  Other Topics Concern  . Not on file  Social History Narrative  . Not on file     Review of Systems: General: negative for chills, fever, night sweats or weight changes.  Cardiovascular: negative for chest pain, dyspnea on exertion, edema, orthopnea, palpitations, paroxysmal nocturnal dyspnea or shortness of breath Dermatological: negative for rash Respiratory: negative for cough or wheezing Urologic: negative for hematuria Abdominal: negative for nausea, vomiting, diarrhea, bright red blood per rectum, melena, or hematemesis Neurologic: negative for visual changes, syncope, or dizziness All other systems reviewed and are otherwise negative except as noted above.    Blood pressure 122/64, pulse 78, height 5\' 8"  (1.727 m), weight 188 lb (85.3 kg).  General appearance: alert and no distress Neck: no adenopathy, no carotid bruit, no JVD, supple, symmetrical, trachea midline and thyroid not enlarged, symmetric, no tenderness/mass/nodules Lungs: clear to auscultation bilaterally Heart: regular rate and rhythm, S1, S2 normal, no murmur, click, rub or gallop Extremities: extremities normal, atraumatic, no cyanosis or edema Pulses: 2+ and symmetric Skin: Skin color, texture, turgor normal. No rashes or lesions Neurologic: Alert and oriented X 3, normal strength and tone. Normal symmetric reflexes. Normal coordination and gait  EKG sinus rhythm at 78 inferior Q waves and early R wave transition.  I personally reviewed this EKG.  ASSESSMENT AND PLAN:   Acute ST elevation myocardial infarction (STEMI) involving left  circumflex coronary artery without development of Q waves (HCC) History of CAD status post inferolateral STEMI 08/04/2016 with PCI and stenting of a large but nondominant circumflex.  He had a high-grade mid dominant RCA stenosis as well as diagonal branch disease and underwent staged intervention RCA 3 days later.  He is been asymptomatic since I saw him a year ago.  He is only on Brilinta twice once a day because he ran out of his prescription.  He denies chest pain or shortness of breath.  HTN (hypertension) History of essential hypertension her blood pressure measured today at 122/64.  He is on metoprolol although he is run out of that medicine and has not been on it in a while.  HLD (hyperlipidemia) Hyperlipidemia on statin therapy although he ran out of his statin drug.  We will refill this and recheck a lipid liver profile in 3 months.      Delton See.  Allyson Sabal MD [facility], [facility] 12/01/2017 3:12 PM

## 2017-12-01 NOTE — Assessment & Plan Note (Signed)
History of CAD status post inferolateral STEMI 08/04/2016 with PCI and stenting of a large but nondominant circumflex.  He had a high-grade mid dominant RCA stenosis as well as diagonal branch disease and underwent staged intervention RCA 3 days later.  He is been asymptomatic since I saw him a year ago.  He is only on Brilinta twice once a day because he ran out of his prescription.  He denies chest pain or shortness of breath.

## 2017-12-01 NOTE — Assessment & Plan Note (Signed)
Hyperlipidemia on statin therapy although he ran out of his statin drug.  We will refill this and recheck a lipid liver profile in 3 months.

## 2017-12-01 NOTE — Assessment & Plan Note (Signed)
History of essential hypertension her blood pressure measured today at 122/64.  He is on metoprolol although he is run out of that medicine and has not been on it in a while.

## 2017-12-04 ENCOUNTER — Encounter: Payer: Self-pay | Admitting: Cardiovascular Disease

## 2018-01-04 ENCOUNTER — Encounter: Payer: Self-pay | Admitting: *Deleted

## 2018-01-04 ENCOUNTER — Other Ambulatory Visit: Payer: Self-pay | Admitting: *Deleted

## 2018-01-04 DIAGNOSIS — I2121 ST elevation (STEMI) myocardial infarction involving left circumflex coronary artery: Secondary | ICD-10-CM

## 2018-01-04 NOTE — Progress Notes (Signed)
p2y

## 2018-01-11 ENCOUNTER — Other Ambulatory Visit (HOSPITAL_COMMUNITY)
Admission: RE | Admit: 2018-01-11 | Discharge: 2018-01-11 | Disposition: A | Discharge status: Home / Self Care | Payer: 59 | Source: Ambulatory Visit | Attending: Cardiovascular Disease | Admitting: Cardiovascular Disease

## 2018-01-11 ENCOUNTER — Encounter: Payer: Self-pay | Admitting: *Deleted

## 2018-01-11 DIAGNOSIS — E7800 Pure hypercholesterolemia, unspecified: Secondary | ICD-10-CM | POA: Diagnosis not present

## 2018-01-11 LAB — PLATELET INHIBITION P2Y12: Platelet Function  P2Y12: 171 [PRU] — ABNORMAL LOW (ref 194–418)

## 2018-01-11 LAB — HEPATIC FUNCTION PANEL
ALBUMIN: 4.3 g/dL (ref 3.6–4.8)
ALT: 22 IU/L (ref 0–44)
AST: 16 IU/L (ref 0–40)
Alkaline Phosphatase: 86 IU/L (ref 39–117)
Bilirubin Total: 0.4 mg/dL (ref 0.0–1.2)
Bilirubin, Direct: 0.13 mg/dL (ref 0.00–0.40)
Total Protein: 6.9 g/dL (ref 6.0–8.5)

## 2018-01-11 LAB — LIPID PANEL
CHOLESTEROL TOTAL: 125 mg/dL (ref 100–199)
Chol/HDL Ratio: 2.2 ratio (ref 0.0–5.0)
HDL: 57 mg/dL (ref >39)
LDL Calculated: 58 mg/dL (ref 0–99)
Triglycerides: 52 mg/dL (ref 0–149)
VLDL CHOLESTEROL CAL: 10 mg/dL (ref 5–40)

## 2018-01-12 ENCOUNTER — Encounter: Payer: Self-pay | Admitting: *Deleted

## 2018-10-30 ENCOUNTER — Other Ambulatory Visit: Payer: Self-pay | Admitting: Cardiovascular Disease

## 2018-11-10 ENCOUNTER — Telehealth: Payer: Self-pay | Admitting: *Deleted

## 2018-11-10 NOTE — Telephone Encounter (Signed)
A message was left, re: follow up visit. 

## 2018-12-03 ENCOUNTER — Other Ambulatory Visit: Payer: Self-pay | Admitting: Cardiovascular Disease

## 2018-12-04 ENCOUNTER — Other Ambulatory Visit: Payer: Self-pay | Admitting: Cardiovascular Disease

## 2018-12-06 NOTE — Telephone Encounter (Signed)
Rx(s) sent to pharmacy electronically.  

## 2018-12-29 ENCOUNTER — Other Ambulatory Visit: Payer: Self-pay | Admitting: Cardiovascular Disease

## 2019-01-26 ENCOUNTER — Other Ambulatory Visit: Payer: Self-pay | Admitting: Cardiovascular Disease

## 2019-01-31 ENCOUNTER — Other Ambulatory Visit: Payer: Self-pay | Admitting: Cardiovascular Disease

## 2019-02-07 ENCOUNTER — Other Ambulatory Visit: Payer: Self-pay | Admitting: Cardiovascular Disease

## 2019-02-28 ENCOUNTER — Other Ambulatory Visit: Payer: Self-pay | Admitting: Cardiovascular Disease

## 2019-04-20 ENCOUNTER — Other Ambulatory Visit: Payer: Self-pay | Admitting: Cardiovascular Disease

## 2019-05-06 ENCOUNTER — Other Ambulatory Visit: Payer: Self-pay | Admitting: Cardiovascular Disease

## 2019-05-12 ENCOUNTER — Encounter: Payer: Self-pay | Admitting: Family Medicine

## 2019-05-12 ENCOUNTER — Ambulatory Visit (INDEPENDENT_AMBULATORY_CARE_PROVIDER_SITE_OTHER): Payer: 59 | Admitting: Family Medicine

## 2019-05-12 ENCOUNTER — Other Ambulatory Visit: Payer: Self-pay

## 2019-05-12 VITALS — Temp 98.6°F | Ht 68.0 in | Wt 159.0 lb

## 2019-05-12 DIAGNOSIS — E1169 Type 2 diabetes mellitus with other specified complication: Secondary | ICD-10-CM | POA: Diagnosis not present

## 2019-05-12 DIAGNOSIS — E785 Hyperlipidemia, unspecified: Secondary | ICD-10-CM | POA: Diagnosis not present

## 2019-05-12 DIAGNOSIS — Z125 Encounter for screening for malignant neoplasm of prostate: Secondary | ICD-10-CM | POA: Diagnosis not present

## 2019-05-12 LAB — BAYER DCA HB A1C WAIVED: HB A1C (BAYER DCA - WAIVED): >14 % — ABNORMAL HIGH (ref <7.0)

## 2019-05-12 MED ORDER — OZEMPIC (0.25 OR 0.5 MG/DOSE) 2 MG/1.5ML ~~LOC~~ SOPN: 0.5000 mg | PEN_INJECTOR | SUBCUTANEOUS | 3 refills | Status: DC

## 2019-05-12 MED ORDER — METFORMIN HCL ER 750 MG PO TB24: 750.0000 mg | ORAL_TABLET | Freq: Every day | ORAL | 5 refills | Status: DC

## 2019-05-12 NOTE — Progress Notes (Signed)
Subjective:  Patient ID: Jackson Booth, male    DOB: 11/10/1955  Age: 63 y.o. MRN: [id number]  CC: New Patient (Initial Visit)   HPI Jackson Booth presents for glucose running between 300-400. Ran out of glipizide 2-3 months ago. Years ago he went to once a da on the metformin due to diarrhea. HE eats whatever he can find- fast food, TV dinners, left overs from his parents meals, etc. HE does exercise in the summer. He cuts wood a lot.   In the pasthis fasting glucose would be good, but his A1c would always be too high. He eventually just gave up. He has never checked post prandial levels.   Depression screen [facility] 2/9 05/12/2019  Decreased Interest 0  Down, Depressed, Hopeless 0  PHQ - 2 Score 0  Altered sleeping 0  Tired, decreased energy 0  Change in appetite 0  Feeling bad or failure about yourself  0  Trouble concentrating 0  Moving slowly or fidgety/restless 0  Suicidal thoughts 0  PHQ-9 Score 0    History Jackson Booth has a past medical history of Anginal pain ([place]), Anxiety, Arthritis, CAD S/P percutaneous coronary angioplasty (08/07/2016), Family history of adverse reaction to anesthesia, High cholesterol, History of vertigo, Hypertension, Inferolateral myocardial infarction ([place]) (08/04/2016), Sinus headache, and Type II diabetes mellitus ([place]).   He has a past surgical history that includes LEFT HEART CATH AND CORONARY ANGIOGRAPHY (N/A, 08/04/2016); CORONARY STENT INTERVENTION (N/A, 08/04/2016); Inguinal hernia repair (Right, 1986); Carpal tunnel release (Right, 1986); Nasal septum surgery (~ 1975; 1977); Coronary angioplasty with stent (; 08/04/2016; 08/07/2016); and CORONARY STENT INTERVENTION (N/A, 08/07/2016).   His family history includes Heart disease in his brother and mother; Hypertension in his father and mother.He reports that he has never smoked. He has never used smokeless tobacco. He reports that he does not drink alcohol or use drugs.  Current Outpatient Medications on File  Prior to Visit  Medication Sig Dispense Refill  . aspirin 81 MG chewable tablet Chew 1 tablet (81 mg total) by mouth daily.    . benazepril (LOTENSIN) 40 MG tablet Take 1 tablet (40 mg total) by mouth every evening. Office visit needed-3RD AND FINAL ATTEMPT 15 tablet 0  . clopidogrel (PLAVIX) 75 MG tablet TAKE 1 TABLET (75 MG TOTAL) BY MOUTH DAILY. *NEEDS APPT FINAL ATTEMPT 15 tablet 0  . metoprolol succinate (TOPROL-XL) 25 MG 24 hr tablet TAKE 1 TABLET (25 MG TOTAL) BY MOUTH DAILY. NEEDS APPT FINAL ATTEMPT 15 tablet 0  . rosuvastatin (CRESTOR) 40 MG tablet TAKE 1 TABLET (40 MG TOTAL) BY MOUTH DAILY. NEEDS APPOINTMENT FOR FUTURE REFILLS 90 tablet 0  . glipiZIDE (GLUCOTROL XL) 10 MG 24 hr tablet Take 10 mg by mouth 2 (two) times daily.     . nitroGLYCERIN (NITROSTAT) 0.4 MG SL tablet Place 1 tablet (0.4 mg total) under the tongue every 5 (five) minutes as needed for chest pain. (Patient not taking: Reported on 05/12/2019) 25 tablet 1   No current facility-administered medications on file prior to visit.     ROS Review of Systems  Constitutional: Negative.   HENT: Negative.   Eyes: Negative for visual disturbance.  Respiratory: Negative for cough and shortness of breath.   Cardiovascular: Negative for chest pain and leg swelling.  Gastrointestinal: Negative for abdominal pain, diarrhea, nausea and vomiting.  Genitourinary: Negative for difficulty urinating.  Musculoskeletal: Negative for arthralgias and myalgias.  Skin: Negative for rash.  Neurological: Negative for headaches.  Psychiatric/Behavioral: Negative for  sleep disturbance.    Objective:  Temp 98.6 F (37 C) (Temporal)   Ht 5\' 8"  (1.727 m)   Wt 159 lb (72.1 kg)   SpO2 98%   BMI 24.18 kg/m   BP Readings from Last 3 Encounters:  12/01/17 122/64  10/28/16 114/72  08/18/16 140/85    Wt Readings from Last 3 Encounters:  05/12/19 159 lb (72.1 kg)  12/01/17 188 lb (85.3 kg)  10/28/16 183 lb 9.6 oz (83.3 kg)      Physical Exam Constitutional:      General: He is not in acute distress.    Appearance: He is well-developed.  HENT:     Head: Normocephalic and atraumatic.     Right Ear: External ear normal.     Left Ear: External ear normal.     Nose: Nose normal.  Eyes:     Conjunctiva/sclera: Conjunctivae normal.     Pupils: Pupils are equal, round, and reactive to light.  Cardiovascular:     Rate and Rhythm: Normal rate and regular rhythm.     Heart sounds: Normal heart sounds. No murmur.  Pulmonary:     Effort: Pulmonary effort is normal. No respiratory distress.     Breath sounds: Normal breath sounds. No wheezing or rales.  Abdominal:     Palpations: Abdomen is soft.     Tenderness: There is no abdominal tenderness.  Musculoskeletal:        General: Normal range of motion.     Cervical back: Normal range of motion and neck supple.  Skin:    General: Skin is warm and dry.     Findings: Bruising (varying stages of healing on left shin) present.  Neurological:     Mental Status: He is alert and oriented to person, place, and time.     Deep Tendon Reflexes: Reflexes are normal and symmetric.  Psychiatric:        Behavior: Behavior normal.        Thought Content: Thought content normal.        Judgment: Judgment normal.       Assessment & Plan:   Jackson Booth was seen today for new patient (initial visit).  Diagnoses and all orders for this visit:  DM type 2 with diabetic dyslipidemia (HCC) -     hgba1c -     Ambulatory referral to diabetic education -     CBC -     CMP -     Lipid -     TSH  Screening for prostate cancer -     PSA Total (Reflex To Free)  Other orders -     metFORMIN (GLUCOPHAGE-XR) 750 MG 24 hr tablet; Take 1 tablet (750 mg total) by mouth daily with breakfast. -     Semaglutide,0.25 or 0.5MG /DOS, (OZEMPIC, 0.25 OR 0.5 MG/DOSE,) 2 MG/1.5ML SOPN; Inject 0.5 mg into the skin once a week.     I have discontinued Jackson Booth. Jackson Booth's metFORMIN, diphenhydrAMINE,  and OVER THE COUNTER MEDICATION. I am also having him start on metFORMIN and Ozempic (0.25 or 0.5 MG/DOSE). Additionally, I am having him maintain his glipiZIDE, aspirin, nitroGLYCERIN, benazepril, rosuvastatin, metoprolol succinate, and clopidogrel.  Allergies as of 05/12/2019      Reactions   Carrot [daucus Carota] Swelling   PT IS ALLERGIC TO RAW CARROTS    Pecan Nut (diagnostic) Itching, Swelling   Tongue swells   Tape Rash      Medication List       Accurate as  of May 12, 2019  3:39 PM. If you have any questions, ask your nurse or doctor.        STOP taking these medications   diphenhydrAMINE 25 mg capsule Commonly known as: BENADRYL Stopped by: Mechele ClaudeWarren Chriselda Leppert, MD   metFORMIN 1000 MG tablet Commonly known as: GLUCOPHAGE Replaced by: metFORMIN 750 MG 24 hr tablet Stopped by: Mechele ClaudeWarren Mikhi Athey, MD   OVER THE COUNTER MEDICATION Stopped by: Mechele ClaudeWarren Tristain Daily, MD     TAKE these medications   aspirin 81 MG chewable tablet Chew 1 tablet (81 mg total) by mouth daily.   benazepril 40 MG tablet Commonly known as: LOTENSIN Take 1 tablet (40 mg total) by mouth every evening. Office visit needed-3RD AND FINAL ATTEMPT   clopidogrel 75 MG tablet Commonly known as: PLAVIX TAKE 1 TABLET (75 MG TOTAL) BY MOUTH DAILY. *NEEDS APPT FINAL ATTEMPT   glipiZIDE 10 MG 24 hr tablet Commonly known as: GLUCOTROL XL Take 10 mg by mouth 2 (two) times daily.   metFORMIN 750 MG 24 hr tablet Commonly known as: GLUCOPHAGE-XR Take 1 tablet (750 mg total) by mouth daily with breakfast. Replaces: metFORMIN 1000 MG tablet Started by: Mechele ClaudeWarren Kiarrah Rausch, MD   metoprolol succinate 25 MG 24 hr tablet Commonly known as: TOPROL-XL TAKE 1 TABLET (25 MG TOTAL) BY MOUTH DAILY. NEEDS APPT FINAL ATTEMPT   nitroGLYCERIN 0.4 MG SL tablet Commonly known as: NITROSTAT Place 1 tablet (0.4 mg total) under the tongue every 5 (five) minutes as needed for chest pain.   Ozempic (0.25 or 0.5 MG/DOSE) 2 MG/1.5ML Sopn  Generic drug: Semaglutide(0.25 or 0.5MG /DOS) Inject 0.5 mg into the skin once a week. Started by: Mechele ClaudeWarren Jamyria Ozanich, MD   rosuvastatin 40 MG tablet Commonly known as: CRESTOR TAKE 1 TABLET (40 MG TOTAL) BY MOUTH DAILY. NEEDS APPOINTMENT FOR FUTURE REFILLS        Follow-up: Return in about 1 month (around 06/12/2019).  Mechele ClaudeWarren Tomer Chalmers, M.D.

## 2019-05-13 LAB — CBC WITH DIFFERENTIAL/PLATELET
Basophils Absolute: 0.1 10*3/uL (ref 0.0–0.2)
Basos: 1 %
EOS (ABSOLUTE): 0.2 10*3/uL (ref 0.0–0.4)
Eos: 3 %
Hematocrit: 44 % (ref 37.5–51.0)
Hemoglobin: 14.7 g/dL (ref 13.0–17.7)
Immature Grans (Abs): 0 10*3/uL (ref 0.0–0.1)
Immature Granulocytes: 0 %
Lymphocytes Absolute: 1.8 10*3/uL (ref 0.7–3.1)
Lymphs: 23 %
MCH: 29.3 pg (ref 26.6–33.0)
MCHC: 33.4 g/dL (ref 31.5–35.7)
MCV: 88 fL (ref 79–97)
Monocytes Absolute: 0.5 10*3/uL (ref 0.1–0.9)
Monocytes: 7 %
Neutrophils Absolute: 5.1 10*3/uL (ref 1.4–7.0)
Neutrophils: 66 %
Platelets: 252 10*3/uL (ref 150–450)
RBC: 5.02 x10E6/uL (ref 4.14–5.80)
RDW: 12.5 % (ref 11.6–15.4)
WBC: 7.7 10*3/uL (ref 3.4–10.8)

## 2019-05-13 LAB — LIPID PANEL
Chol/HDL Ratio: 2.2 ratio (ref 0.0–5.0)
Cholesterol, Total: 122 mg/dL (ref 100–199)
HDL: 55 mg/dL (ref >39)
LDL Chol Calc (NIH): 48 mg/dL (ref 0–99)
Triglycerides: 100 mg/dL (ref 0–149)
VLDL Cholesterol Cal: 19 mg/dL (ref 5–40)

## 2019-05-13 LAB — CMP14+EGFR
ALT: 43 IU/L (ref 0–44)
AST: 25 IU/L (ref 0–40)
Albumin/Globulin Ratio: 2.4 — ABNORMAL HIGH (ref 1.2–2.2)
Albumin: 4.1 g/dL (ref 3.8–4.8)
Alkaline Phosphatase: 96 IU/L (ref 39–117)
BUN/Creatinine Ratio: 10 (ref 10–24)
BUN: 12 mg/dL (ref 8–27)
Bilirubin Total: 0.5 mg/dL (ref 0.0–1.2)
CO2: 25 mmol/L (ref 20–29)
Calcium: 9.5 mg/dL (ref 8.6–10.2)
Chloride: 97 mmol/L (ref 96–106)
Creatinine, Ser: 1.24 mg/dL (ref 0.76–1.27)
GFR calc Af Amer: 72 mL/min/{1.73_m2} (ref >59)
GFR calc non Af Amer: 62 mL/min/{1.73_m2} (ref >59)
Globulin, Total: 1.7 g/dL (ref 1.5–4.5)
Glucose: 423 mg/dL (ref 65–99)
Potassium: 4.5 mmol/L (ref 3.5–5.2)
Sodium: 133 mmol/L — ABNORMAL LOW (ref 134–144)
Total Protein: 5.8 g/dL — ABNORMAL LOW (ref 6.0–8.5)

## 2019-05-13 LAB — PSA TOTAL (REFLEX TO FREE): Prostate Specific Ag, Serum: 0.9 ng/mL (ref 0.0–4.0)

## 2019-05-13 LAB — TSH: TSH: 1.36 u[IU]/mL (ref 0.450–4.500)

## 2019-05-24 ENCOUNTER — Other Ambulatory Visit: Payer: Self-pay | Admitting: Cardiovascular Disease

## 2019-05-30 ENCOUNTER — Other Ambulatory Visit: Payer: Self-pay | Admitting: Cardiovascular Disease

## 2019-06-08 ENCOUNTER — Encounter: Payer: Self-pay | Admitting: Cardiovascular Disease

## 2019-06-08 ENCOUNTER — Other Ambulatory Visit: Payer: Self-pay

## 2019-06-08 ENCOUNTER — Ambulatory Visit: Payer: 59 | Admitting: Cardiovascular Disease

## 2019-06-08 VITALS — BP 131/80 | HR 78 | Temp 97.7°F | Ht 69.0 in | Wt 156.8 lb

## 2019-06-08 DIAGNOSIS — E782 Mixed hyperlipidemia: Secondary | ICD-10-CM | POA: Diagnosis not present

## 2019-06-08 DIAGNOSIS — I2121 ST elevation (STEMI) myocardial infarction involving left circumflex coronary artery: Secondary | ICD-10-CM | POA: Diagnosis not present

## 2019-06-08 DIAGNOSIS — I10 Essential (primary) hypertension: Secondary | ICD-10-CM

## 2019-06-08 MED ORDER — CLOPIDOGREL BISULFATE 75 MG PO TABS: 75.0000 mg | ORAL_TABLET | Freq: Every day | ORAL | 3 refills | Status: DC

## 2019-06-08 MED ORDER — ROSUVASTATIN CALCIUM 40 MG PO TABS: 40.0000 mg | ORAL_TABLET | Freq: Every day | ORAL | 3 refills | Status: DC

## 2019-06-08 MED ORDER — METOPROLOL SUCCINATE ER 25 MG PO TB24: 25.0000 mg | ORAL_TABLET | Freq: Every day | ORAL | 3 refills | Status: DC

## 2019-06-08 NOTE — Assessment & Plan Note (Signed)
Patient hyperlipidemia on statin therapy with lipid profile performed 05/12/2019 revealing total cholesterol 122, LDL 58 and HDL 55.

## 2019-06-08 NOTE — Patient Instructions (Signed)
Medication Instructions:  Your physician recommends that you continue on your current medications as directed. Please refer to the Current Medication list given to you today.  If you need a refill on your cardiac medications before your next appointment, please call your pharmacy.   Lab work: NONE  Testing/Procedures: NONE  Follow-Up: At [facility], you and your health needs are our priority.  As part of our continuing mission to provide you with exceptional heart care, we have created designated Provider Care Teams.  These Care Teams include your primary Cardiologist (physician) and Advanced Practice Providers (APPs -  Physician Assistants and Nurse Practitioners) who all work together to provide you with the care you need, when you need it. You may see Dr. Berry or one of the following Advanced Practice Providers on your designated Care Team:    Luke Kilroy, PA-C  Callie Goodrich, PA-C  Jesse Cleaver, FNP  Your physician wants you to follow-up in: 1 year with Dr. Berry       

## 2019-06-08 NOTE — Assessment & Plan Note (Signed)
History of essential hypertension with blood pressure measured at 131/80 which she says is somewhat high for him.  He is on Lotensin and metoprolol.

## 2019-06-08 NOTE — Progress Notes (Signed)
06/08/2019 Jackson Booth   08/04/1955  [id number]  Primary Physician Jackson Fraise, MD Primary Cardiologist: Jackson Harp MD Jackson Booth, [place], [place]  HPI:  Jackson Booth is a 64 y.o.  mildly overweight single Caucasian male who lives in [place]. His risk factors include treated hypertension and diabetes. He has a strong family history of heart disease. He's had no prior cardiac history.  I last saw him in the office 12/01/2017.  He developed substernal chest pain relating to his jaw and left upper extremity at approximately noon today. He called EMS. His initial EKG showed ST segment elevation inferolaterally. He is admitted with inferolateral ST segment elevation and was brought urgently to the cardiac catheterization laboratory where radial cath revealed an occluded nondominant but fairly large circumflex, high-grade proximal moderate size diagonal branch disease and 80% mid to distal dominant RCA stenosis and a posterolateral wall motion abnormality. He underwent PCI and drug-eluting stenting of his circumflex coronary artery with a balloon time of 21 minutes. His peak troponin was 36. His ejection fraction was 50-55% by 2-D echo.He underwent staged mid dominant RCA PCI and drug-eluting stenting 3 days later and he was discharged home on 08/08/16. He started well since. He remains on dual antibiotic therapy and was asymptomatic. His most recent lipid profile performed 05/12/2019 revealed cholesterol 122, LDL 58 HDL 55.  Since I saw him a year and a half ago he is remained stable.  He is completely asymptomatic denying chest pain or shortness of breath.  He spends a lot of time with his elderly parents.  He is fairly active and does yard and tree work.  He has lost 30 pounds since I saw him last for unclear reasons and is seeing Dr. Quinn Booth for further evaluation of this.  Current Meds  Medication Sig  . aspirin 81 MG chewable tablet Chew 1 tablet (81 mg total) by mouth daily.  .  benazepril (LOTENSIN) 40 MG tablet Take 1 tablet (40 mg total) by mouth every evening. Office visit needed-3RD AND FINAL ATTEMPT  . clopidogrel (PLAVIX) 75 MG tablet Take 1 tablet (75 mg total) by mouth daily.  . metFORMIN (GLUCOPHAGE-XR) 750 MG 24 hr tablet Take 1 tablet (750 mg total) by mouth daily with breakfast. (Patient taking differently: Take 750 mg by mouth daily with breakfast. TAKE 750 MG IN THE MORNING AND 1000 MG AT BEDTIME.)  . metoprolol succinate (TOPROL-XL) 25 MG 24 hr tablet Take 1 tablet (25 mg total) by mouth daily.  . nitroGLYCERIN (NITROSTAT) 0.4 MG SL tablet Place 1 tablet (0.4 mg total) under the tongue every 5 (five) minutes as needed for chest pain.  . rosuvastatin (CRESTOR) 40 MG tablet Take 1 tablet (40 mg total) by mouth daily.  . Semaglutide,0.25 or 0.5MG /DOS, (OZEMPIC, 0.25 OR 0.5 MG/DOSE,) 2 MG/1.5ML SOPN Inject 0.5 mg into the skin once a week.  . [DISCONTINUED] clopidogrel (PLAVIX) 75 MG tablet TAKE 1 TABLET (75 MG TOTAL) BY MOUTH DAILY. *NEEDS APPT FINAL ATTEMPT  . [DISCONTINUED] metoprolol succinate (TOPROL-XL) 25 MG 24 hr tablet TAKE 1 TABLET (25 MG TOTAL) BY MOUTH DAILY. NEEDS APPT FINAL ATTEMPT  . [DISCONTINUED] rosuvastatin (CRESTOR) 40 MG tablet TAKE 1 TABLET (40 MG TOTAL) BY MOUTH DAILY. NEEDS APPOINTMENT FOR FUTURE REFILLS     Allergies  Allergen Reactions  . Carrot [Daucus Carota] Swelling    PT IS ALLERGIC TO RAW CARROTS   . Pecan Nut (Diagnostic) Itching and Swelling    Tongue swells  .  Tape Rash    Social History   Socioeconomic History  . Marital status: Single    Spouse name: Not on file  . Number of children: Not on file  . Years of education: Not on file  . Highest education level: Not on file  Occupational History  . Not on file  Tobacco Use  . Smoking status: Never Smoker  . Smokeless tobacco: Never Used  Substance and Sexual Activity  . Alcohol use: No  . Drug use: No  . Sexual activity: Never  Other Topics Concern  . Not on  file  Social History Narrative  . Not on file   Social Determinants of Health   Financial Resource Strain:   . Difficulty of Paying Living Expenses: Not on file  Food Insecurity:   . Worried About Programme researcher, broadcasting/film/video in the Last Year: Not on file  . Ran Out of Food in the Last Year: Not on file  Transportation Needs:   . Lack of Transportation (Medical): Not on file  . Lack of Transportation (Non-Medical): Not on file  Physical Activity:   . Days of Exercise per Week: Not on file  . Minutes of Exercise per Session: Not on file  Stress:   . Feeling of Stress : Not on file  Social Connections:   . Frequency of Communication with Friends and Family: Not on file  . Frequency of Social Gatherings with Friends and Family: Not on file  . Attends Religious Services: Not on file  . Active Member of Clubs or Organizations: Not on file  . Attends Banker Meetings: Not on file  . Marital Status: Not on file  Intimate Partner Violence:   . Fear of Current or Ex-Partner: Not on file  . Emotionally Abused: Not on file  . Physically Abused: Not on file  . Sexually Abused: Not on file     Review of Systems: General: negative for chills, fever, night sweats or weight changes.  Cardiovascular: negative for chest pain, dyspnea on exertion, edema, orthopnea, palpitations, paroxysmal nocturnal dyspnea or shortness of breath Dermatological: negative for rash Respiratory: negative for cough or wheezing Urologic: negative for hematuria Abdominal: negative for nausea, vomiting, diarrhea, bright red blood per rectum, melena, or hematemesis Neurologic: negative for visual changes, syncope, or dizziness All other systems reviewed and are otherwise negative except as noted above.    Blood pressure 131/80, pulse 78, temperature 97.7 F (36.5 C), height 5\' 9"  (1.753 m), weight 156 lb 12.8 oz (71.1 kg), SpO2 97 %.  General appearance: alert and no distress Neck: no adenopathy, no carotid  bruit, no JVD, supple, symmetrical, trachea midline and thyroid not enlarged, symmetric, no tenderness/mass/nodules Lungs: clear to auscultation bilaterally Heart: regular rate and rhythm, S1, S2 normal, no murmur, click, rub or gallop Extremities: extremities normal, atraumatic, no cyanosis or edema Pulses: 2+ and symmetric Skin: Skin color, texture, turgor normal. No rashes or lesions Neurologic: Alert and oriented X 3, normal strength and tone. Normal symmetric reflexes. Normal coordination and gait  EKG sinus rhythm at 78 without ST or T wave changes.  I personally reviewed this EKG.  ASSESSMENT AND PLAN:   Acute ST elevation myocardial infarction (STEMI) involving left circumflex coronary artery without development of Q waves (HCC) History of CAD status post inferolateral STEMI 08/04/2016 treated with PCI drug-eluting stenting of the circumflex coronary artery with a door to balloon time of 21 minutes.  His peak troponin was 36.  His EF was 50 to  55%.  The following day I performed staged RCA PCI and stenting and he was discharged home after that.  He is done well and denies chest pain or shortness of breath.  HTN (hypertension) History of essential hypertension with blood pressure measured at 131/80 which she says is somewhat high for him.  He is on Lotensin and metoprolol.  HLD (hyperlipidemia) Patient hyperlipidemia on statin therapy with lipid profile performed 05/12/2019 revealing total cholesterol 122, LDL 58 and HDL 55.      Runell Gess MD [facility], [facility] 06/08/2019 10:37 AM

## 2019-06-08 NOTE — Assessment & Plan Note (Signed)
History of CAD status post inferolateral STEMI 08/04/2016 treated with PCI drug-eluting stenting of the circumflex coronary artery with a door to balloon time of 21 minutes.  His peak troponin was 36.  His EF was 50 to 55%.  The following day I performed staged RCA PCI and stenting and he was discharged home after that.  He is done well and denies chest pain or shortness of breath.

## 2019-06-13 ENCOUNTER — Other Ambulatory Visit: Payer: Self-pay

## 2019-06-14 ENCOUNTER — Other Ambulatory Visit: Payer: Self-pay

## 2019-06-14 ENCOUNTER — Encounter: Payer: Self-pay | Admitting: Family Medicine

## 2019-06-14 ENCOUNTER — Ambulatory Visit: Payer: 59 | Admitting: Family Medicine

## 2019-06-14 VITALS — BP 130/86 | HR 73 | Temp 97.8°F | Ht 69.0 in | Wt 158.0 lb

## 2019-06-14 DIAGNOSIS — E782 Mixed hyperlipidemia: Secondary | ICD-10-CM | POA: Diagnosis not present

## 2019-06-14 DIAGNOSIS — E785 Hyperlipidemia, unspecified: Secondary | ICD-10-CM

## 2019-06-14 DIAGNOSIS — E1169 Type 2 diabetes mellitus with other specified complication: Secondary | ICD-10-CM

## 2019-06-14 DIAGNOSIS — I2510 Atherosclerotic heart disease of native coronary artery without angina pectoris: Secondary | ICD-10-CM

## 2019-06-14 DIAGNOSIS — I10 Essential (primary) hypertension: Secondary | ICD-10-CM | POA: Diagnosis not present

## 2019-06-14 NOTE — Progress Notes (Signed)
Subjective:  Patient ID: Jackson Booth, male    DOB: 1955/08/22  Age: 64 y.o. MRN: [id number]  CC: Follow-up (1 month)   HPI Jackson Booth presents forFollow-up of diabetes. Patient checks blood sugar at home.  Readings trended 300-400 at the time he started using Ozempic. He had nausea at first but it has resolved. His glucose dropped to 150 tp low 200s with the use of the med after three weeks. Unfortunately he could no afford the medication so he ran out last week. Now glucose is trending back up , The last two readings were over 500.  Patient denies symptoms such as polyuria, polydipsia, excessive hunger, nausea No significant hypoglycemic spells noted. Medications reviewed.   Having aching in the hips. Keeps him awake at night sometimes.    presents for  follow-up of hypertension. Patient has no history of headache chest pain or shortness of breath or recent cough. Patient also denies symptoms of TIA such as focal numbness or weakness. Patient denies side effects from medication. States taking it regularly.    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   He has a past surgical history that includes LEFT HEART CATH AND CORONARY ANGIOGRAPHY (N/A, 08/04/2016); CORONARY STENT INTERVENTION (N/A, 08/04/2016); Inguinal hernia repair (Right, 1986); Carpal tunnel release (Right, 1986); Nasal septum surgery (~ 1975; 1977); Coronary angioplasty with stent (; 08/04/2016; 08/07/2016); and CORONARY STENT INTERVENTION (N/A, 08/07/2016).   His family history includes Heart disease in his brother and mother; Hypertension in his father and mother.He reports that he has never smoked. He has never used  smokeless tobacco. He reports that he does not drink alcohol or use drugs.  Current Outpatient Medications on File Prior to Visit  Medication Sig Dispense Refill  . aspirin 81 MG chewable tablet Chew 1 tablet (81 mg total) by mouth daily.     No current facility-administered medications on file prior to visit.    ROS Review of Systems  Constitutional: Negative for fever.  Respiratory: Negative for shortness of breath.   Cardiovascular: Negative for chest pain.  Musculoskeletal: Negative for arthralgias.  Skin: Negative for rash.    Objective:  BP 130/86   Pulse 73   Temp 97.8 F (36.6 C) (Temporal)   Ht 5\' 9"  (1.753 m)   Wt 158 lb (71.7 kg)   BMI 23.33 kg/m   BP Readings from Last 3 Encounters:  06/14/19 130/86  06/08/19 131/80  12/01/17 122/64    Wt Readings from Last 3 Encounters:  06/14/19 158 lb (71.7 kg)  06/08/19 156 lb 12.8 oz (71.1 kg)  05/12/19 159 lb (72.1 kg)     Physical Exam Vitals reviewed.  Constitutional:      Appearance: He is well-developed.  HENT:     Head: Normocephalic and atraumatic.     Right Ear: External ear normal.     Left Ear: External ear normal.     Mouth/Throat:     Pharynx: No oropharyngeal exudate or posterior oropharyngeal erythema.  Eyes:     Pupils: Pupils are equal, round, and reactive to light.  Cardiovascular:     Rate and Rhythm: Normal rate and regular rhythm.     Heart sounds: No murmur.  Pulmonary:     Effort: No respiratory distress.  Breath sounds: Normal breath sounds.  Musculoskeletal:     Cervical back: Normal range of motion and neck supple.  Neurological:     Mental Status: He is alert and oriented to person, place, and time.       Assessment & Plan:   Jackson Booth was seen today for follow-up.  Diagnoses and all orders for this visit:  Essential hypertension -     metoprolol succinate (TOPROL-XL) 25 MG 24 hr tablet; Take 1 tablet (25 mg total) by mouth daily. -     benazepril (LOTENSIN) 40 MG  tablet; Take 1 tablet (40 mg total) by mouth every evening. Office visit needed-3RD AND FINAL ATTEMPT  Coronary artery disease involving native coronary artery of native heart without angina pectoris -     metoprolol succinate (TOPROL-XL) 25 MG 24 hr tablet; Take 1 tablet (25 mg total) by mouth daily. -     rosuvastatin (CRESTOR) 40 MG tablet; Take 1 tablet (40 mg total) by mouth daily. -     clopidogrel (PLAVIX) 75 MG tablet; Take 1 tablet (75 mg total) by mouth daily. -     nitroGLYCERIN (NITROSTAT) 0.4 MG SL tablet; Place 1 tablet (0.4 mg total) under the tongue every 5 (five) minutes as needed for chest pain.  DM type 2 with diabetic dyslipidemia (HCC) -     metFORMIN (GLUCOPHAGE-XR) 750 MG 24 hr tablet; Take 1 tablet (750 mg total) by mouth daily with breakfast. -     benazepril (LOTENSIN) 40 MG tablet; Take 1 tablet (40 mg total) by mouth every evening. Office visit needed-3RD AND FINAL ATTEMPT  Mixed hyperlipidemia -     rosuvastatin (CRESTOR) 40 MG tablet; Take 1 tablet (40 mg total) by mouth daily.      I have discontinued Jackson Booth's glipiZIDE and Ozempic (0.25 or 0.5 MG/DOSE). I am also having him maintain his aspirin, metFORMIN, metoprolol succinate, rosuvastatin, benazepril, clopidogrel, and nitroGLYCERIN.  Meds ordered this encounter  Medications  . metFORMIN (GLUCOPHAGE-XR) 750 MG 24 hr tablet    Sig: Take 1 tablet (750 mg total) by mouth daily with breakfast.    Dispense:  30 tablet    Refill:  5  . metoprolol succinate (TOPROL-XL) 25 MG 24 hr tablet    Sig: Take 1 tablet (25 mg total) by mouth daily.    Dispense:  90 tablet    Refill:  3  . rosuvastatin (CRESTOR) 40 MG tablet    Sig: Take 1 tablet (40 mg total) by mouth daily.    Dispense:  90 tablet    Refill:  3  . benazepril (LOTENSIN) 40 MG tablet    Sig: Take 1 tablet (40 mg total) by mouth every evening. Office visit needed-3RD AND FINAL ATTEMPT    Dispense:  15 tablet    Refill:  0  . clopidogrel  (PLAVIX) 75 MG tablet    Sig: Take 1 tablet (75 mg total) by mouth daily.    Dispense:  90 tablet    Refill:  3  . nitroGLYCERIN (NITROSTAT) 0.4 MG SL tablet    Sig: Place 1 tablet (0.4 mg total) under the tongue every 5 (five) minutes as needed for chest pain.    Dispense:  25 tablet    Refill:  1   Patient is having trouble being able to afford the medication prescribed.  He was provided samples of Trulicity 1.5 to take weekly for the next 4 weeks prior to his next follow-up.  Hopefully this will continue  to help his glucose to come into a more healthy range.  Of note is that he has had an MI approximately 2 years ago and we are focusing now on risk factor control.  This includes controlling his cholesterol with Crestor.  Blood pressure with meds noted above as well.  He uses club [employer] for prevention.  For now the risk factor most in need of attention is the diabetes which is being addressed as noted above.  Diet and exercise highly recommended as well on as medication.  Diet and exercise strategies reviewed with the patient.  Follow-up: Return in about 1 month (around 07/15/2019).  Claretta Fraise, M.D.

## 2019-06-18 ENCOUNTER — Encounter: Payer: Self-pay | Admitting: Family Medicine

## 2019-06-18 MED ORDER — ROSUVASTATIN CALCIUM 40 MG PO TABS: 40.0000 mg | ORAL_TABLET | Freq: Every day | ORAL | 3 refills | Status: DC

## 2019-06-18 MED ORDER — METOPROLOL SUCCINATE ER 25 MG PO TB24: 25.0000 mg | ORAL_TABLET | Freq: Every day | ORAL | 3 refills | Status: DC

## 2019-06-18 MED ORDER — CLOPIDOGREL BISULFATE 75 MG PO TABS: 75.0000 mg | ORAL_TABLET | Freq: Every day | ORAL | 3 refills | Status: DC

## 2019-06-18 MED ORDER — METFORMIN HCL ER 750 MG PO TB24: 750.0000 mg | ORAL_TABLET | Freq: Every day | ORAL | 5 refills | Status: DC

## 2019-06-18 MED ORDER — NITROGLYCERIN 0.4 MG SL SUBL: 0.4000 mg | SUBLINGUAL_TABLET | SUBLINGUAL | 1 refills | Status: DC | PRN

## 2019-06-18 MED ORDER — BENAZEPRIL HCL 40 MG PO TABS: 40.0000 mg | ORAL_TABLET | Freq: Every evening | ORAL | 0 refills | Status: DC

## 2019-06-29 ENCOUNTER — Other Ambulatory Visit: Payer: Self-pay | Admitting: Family Medicine

## 2019-06-29 DIAGNOSIS — I10 Essential (primary) hypertension: Secondary | ICD-10-CM

## 2019-06-29 DIAGNOSIS — E1169 Type 2 diabetes mellitus with other specified complication: Secondary | ICD-10-CM

## 2019-06-29 DIAGNOSIS — E785 Hyperlipidemia, unspecified: Secondary | ICD-10-CM

## 2019-07-11 ENCOUNTER — Other Ambulatory Visit: Payer: Self-pay

## 2019-07-12 ENCOUNTER — Encounter: Payer: Self-pay | Admitting: Family Medicine

## 2019-07-12 ENCOUNTER — Ambulatory Visit: Payer: 59 | Admitting: Family Medicine

## 2019-07-12 VITALS — BP 116/82 | HR 87 | Temp 98.0°F | Ht 69.0 in | Wt 158.8 lb

## 2019-07-12 DIAGNOSIS — E1169 Type 2 diabetes mellitus with other specified complication: Secondary | ICD-10-CM

## 2019-07-12 DIAGNOSIS — E785 Hyperlipidemia, unspecified: Secondary | ICD-10-CM

## 2019-07-12 LAB — MICROSCOPIC EXAMINATION
Bacteria, UA: NONE SEEN
RBC, Urine: NONE SEEN /hpf (ref 0–2)
Renal Epithel, UA: NONE SEEN /hpf
WBC, UA: NONE SEEN /hpf (ref 0–5)

## 2019-07-12 LAB — URINALYSIS, COMPLETE
Bilirubin, UA: NEGATIVE
Ketones, UA: NEGATIVE
Leukocytes,UA: NEGATIVE
Nitrite, UA: NEGATIVE
Protein,UA: NEGATIVE
RBC, UA: NEGATIVE
Specific Gravity, UA: >1.03 — ABNORMAL HIGH (ref 1.005–1.030)
Urobilinogen, Ur: 0.2 mg/dL (ref 0.2–1.0)
pH, UA: 5 (ref 5.0–7.5)

## 2019-07-12 MED ORDER — GLIPIZIDE 5 MG PO TABS: 5.0000 mg | ORAL_TABLET | Freq: Every day | ORAL | 2 refills | Status: DC

## 2019-07-12 MED ORDER — OZEMPIC (1 MG/DOSE) 2 MG/1.5ML ~~LOC~~ SOPN: 1.0000 mg | PEN_INJECTOR | SUBCUTANEOUS | 5 refills | Status: DC

## 2019-07-12 NOTE — Progress Notes (Signed)
Subjective:  Patient ID: Jackson Booth, male    DOB: Jun 23, 1955  Age: 64 y.o. MRN: [id number]  CC: Follow-up (4 week)   HPI JEREMY DITULLIO presents forFollow-up of diabetes. Patient checks blood sugar at home.  Log reviewed showing fasting running 180- 300. Post prandial 300-400.  Patient denies symptoms such as polyuria, polydipsia, excessive hunger, nausea No significant hypoglycemic spells noted. Medications reviewed. Pt reports taking them regularly without complication/adverse reaction being reported today.    History Storm has a past medical history of Anginal pain ([place]), Anxiety, Arthritis, CAD S/P percutaneous coronary angioplasty (08/07/2016), Family history of adverse reaction to anesthesia, High cholesterol, History of vertigo, Hypertension, Inferolateral myocardial infarction ([place]) (08/04/2016), Sinus headache, and Type II diabetes mellitus ([place]).   He has a past surgical history that includes LEFT HEART CATH AND CORONARY ANGIOGRAPHY (N/A, 08/04/2016); CORONARY STENT INTERVENTION (N/A, 08/04/2016); Inguinal hernia repair (Right, 1986); Carpal tunnel release (Right, 1986); Nasal septum surgery (~ 1975; 1977); Coronary angioplasty with stent (; 08/04/2016; 08/07/2016); and CORONARY STENT INTERVENTION (N/A, 08/07/2016).   His family history includes Heart disease in his brother and mother; Hypertension in his father and mother.He reports that he has never smoked. He has never used smokeless tobacco. He reports that he does not drink alcohol or use drugs.  Current Outpatient Medications on File Prior to Visit  Medication Sig Dispense Refill  . aspirin 81 MG chewable tablet Chew 1 tablet (81 mg total) by mouth daily.    . benazepril (LOTENSIN) 40 MG tablet Take 1 tablet (40 mg total) by mouth every evening. Office visit needed-3RD AND FINAL ATTEMPT 15 tablet 0  . clopidogrel (PLAVIX) 75 MG tablet Take 1 tablet (75 mg total) by mouth daily. 90 tablet 3  . metFORMIN (GLUCOPHAGE-XR) 750 MG 24  hr tablet Take 1 tablet (750 mg total) by mouth daily with breakfast. 30 tablet 5  . metoprolol succinate (TOPROL-XL) 25 MG 24 hr tablet Take 1 tablet (25 mg total) by mouth daily. 90 tablet 3  . nitroGLYCERIN (NITROSTAT) 0.4 MG SL tablet Place 1 tablet (0.4 mg total) under the tongue every 5 (five) minutes as needed for chest pain. 25 tablet 1  . rosuvastatin (CRESTOR) 40 MG tablet Take 1 tablet (40 mg total) by mouth daily. 90 tablet 3   No current facility-administered medications on file prior to visit.    ROS Review of Systems  Constitutional: Negative for fever.  Respiratory: Negative for shortness of breath.   Cardiovascular: Negative for chest pain.  Musculoskeletal: Negative for arthralgias.  Skin: Negative for rash.    Objective:  BP 116/82   Pulse 87   Temp 98 F (36.7 C) (Temporal)   Ht 5' 9"  (1.753 m)   Wt 158 lb 12.8 oz (72 kg)   BMI 23.45 kg/m   BP Readings from Last 3 Encounters:  07/12/19 116/82  06/14/19 130/86  06/08/19 131/80    Wt Readings from Last 3 Encounters:  07/12/19 158 lb 12.8 oz (72 kg)  06/14/19 158 lb (71.7 kg)  06/08/19 156 lb 12.8 oz (71.1 kg)     Physical Exam Vitals reviewed.  Constitutional:      Appearance: He is well-developed.  HENT:     Head: Normocephalic and atraumatic.     Right Ear: External ear normal.     Left Ear: External ear normal.     Mouth/Throat:     Pharynx: No oropharyngeal exudate or posterior oropharyngeal erythema.  Eyes:     Pupils:  Pupils are equal, round, and reactive to light.  Cardiovascular:     Rate and Rhythm: Normal rate and regular rhythm.     Heart sounds: No murmur.  Pulmonary:     Effort: No respiratory distress.     Breath sounds: Normal breath sounds.  Musculoskeletal:     Cervical back: Normal range of motion and neck supple.  Neurological:     Mental Status: He is alert and oriented to person, place, and time.       Assessment & Plan:   Cheston was seen today for follow-up.   Diagnoses and all orders for this visit:  DM type 2 with diabetic dyslipidemia ([place]) -     CMP14+EGFR -     Urinalysis, Complete  Other orders -     Semaglutide, 1 MG/DOSE, (OZEMPIC, 1 MG/DOSE,) 2 MG/1.5ML SOPN; Inject 1 mg into the skin once a week. -     glipiZIDE (GLUCOTROL) 5 MG tablet; Take 1 tablet (5 mg total) by mouth daily before breakfast. -     Microscopic Examination      I am having Colonel G. Bunt start on Ozempic (1 MG/DOSE) and glipiZIDE. I am also having him maintain his aspirin, metFORMIN, metoprolol succinate, rosuvastatin, benazepril, clopidogrel, and nitroGLYCERIN.  Meds ordered this encounter  Medications  . Semaglutide, 1 MG/DOSE, (OZEMPIC, 1 MG/DOSE,) 2 MG/1.5ML SOPN    Sig: Inject 1 mg into the skin once a week.    Dispense:  3 mL    Refill:  5  . glipiZIDE (GLUCOTROL) 5 MG tablet    Sig: Take 1 tablet (5 mg total) by mouth daily before breakfast.    Dispense:  30 tablet    Refill:  2    Patient with inadequate response so far to Ozempic.  He is also using Metformin.  I have taken him off the glipizide when he first presented, however at this point it looks like a small dose would be beneficial to him.  He is very concerned about financial commitment.  He mentions that he has been able to through his insurance get Ozempic for $80 per 3 months.  He is concerned about how much that costs.  I explained to him that comes out to $27 a month which for this type of medicine was quite a bargain.  But as result I think I am going to need to find some very cost effective measures such as the glipizide for him in the future.  However the present blood sugars being so far above the recommended goal indicates that her good have to prioritize getting the sugar down by whatever means necessary.  Today the Ozempic dose was increased also.  Hopefully that will not increase his co-pay.  I discussed with him that it may be necessary to use insulin.  He will keep track of his blood  sugars this month and report those back with his return in 1 month. Follow-up: Return in about 1 month (around 08/09/2019) for diabetes.  Claretta Fraise, M.D.

## 2019-07-13 ENCOUNTER — Telehealth: Payer: Self-pay | Admitting: Family Medicine

## 2019-07-13 ENCOUNTER — Other Ambulatory Visit: Payer: Self-pay | Admitting: Family Medicine

## 2019-07-13 LAB — CMP14+EGFR
ALT: 22 IU/L (ref 0–44)
AST: 18 IU/L (ref 0–40)
Albumin/Globulin Ratio: 2.3 — ABNORMAL HIGH (ref 1.2–2.2)
Albumin: 4.5 g/dL (ref 3.8–4.8)
Alkaline Phosphatase: 94 IU/L (ref 39–117)
BUN/Creatinine Ratio: 10 (ref 10–24)
BUN: 10 mg/dL (ref 8–27)
Bilirubin Total: 0.5 mg/dL (ref 0.0–1.2)
CO2: 20 mmol/L (ref 20–29)
Calcium: 10 mg/dL (ref 8.6–10.2)
Chloride: 100 mmol/L (ref 96–106)
Creatinine, Ser: 1 mg/dL (ref 0.76–1.27)
GFR calc Af Amer: 93 mL/min/{1.73_m2} (ref >59)
GFR calc non Af Amer: 81 mL/min/{1.73_m2} (ref >59)
Globulin, Total: 2 g/dL (ref 1.5–4.5)
Glucose: 266 mg/dL — ABNORMAL HIGH (ref 65–99)
Potassium: 4.5 mmol/L (ref 3.5–5.2)
Sodium: 140 mmol/L (ref 134–144)
Total Protein: 6.5 g/dL (ref 6.0–8.5)

## 2019-07-13 NOTE — Progress Notes (Signed)
Labs look good. The blood sugar is high, but no more than expected from his glucose log sheet he brought in. The urine was free of protein. Thefrthiness is from sugar in the urine instead. This is mportant because protein indicates pending kidney failure. Sugar only indicates that we have work to do on the diabetes - which we already knew! [employer]

## 2019-07-13 NOTE — Telephone Encounter (Signed)
Patient aware of lab results.

## 2019-07-14 ENCOUNTER — Other Ambulatory Visit: Payer: Self-pay | Admitting: Family Medicine

## 2019-07-14 ENCOUNTER — Telehealth: Payer: Self-pay | Admitting: Family Medicine

## 2019-07-14 DIAGNOSIS — E1169 Type 2 diabetes mellitus with other specified complication: Secondary | ICD-10-CM

## 2019-07-14 MED ORDER — GLIPIZIDE 5 MG PO TABS: 5.0000 mg | ORAL_TABLET | Freq: Every day | ORAL | 2 refills | Status: DC

## 2019-07-14 MED ORDER — OZEMPIC (1 MG/DOSE) 2 MG/1.5ML ~~LOC~~ SOPN: 1.0000 mg | PEN_INJECTOR | SUBCUTANEOUS | 3 refills | Status: DC

## 2019-07-14 NOTE — Telephone Encounter (Signed)
Patient's insurance does not want to pay for a month supply on medications.  Please send in new script for ozempic and glipizide.

## 2019-07-14 NOTE — Telephone Encounter (Signed)
What is the name of the medication? Semaglutide, 1 MG/DOSE, (OZEMPIC, 1 MG/DOSE,) 2 MG/1.5ML SOPN   Have you contacted your pharmacy to request a refill? YES  Which pharmacy would you like this sent to? [facility] needs three month supply insurance will only pay for that   Patient notified that their request is being sent to the clinical staff for review and that they should receive a call once it is complete. If they do not receive a call within 24 hours they can check with their pharmacy or our office.

## 2019-07-14 NOTE — Telephone Encounter (Signed)
I sent in the requested prescription 

## 2019-07-14 NOTE — Telephone Encounter (Signed)
Patient aware, per message left on voice mail, 90 day scripts are ready.

## 2019-08-10 ENCOUNTER — Other Ambulatory Visit: Payer: Self-pay

## 2019-08-11 ENCOUNTER — Encounter: Payer: Self-pay | Admitting: Family Medicine

## 2019-08-11 ENCOUNTER — Other Ambulatory Visit: Payer: Self-pay

## 2019-08-11 ENCOUNTER — Ambulatory Visit (INDEPENDENT_AMBULATORY_CARE_PROVIDER_SITE_OTHER): Payer: 59 | Admitting: Family Medicine

## 2019-08-11 VITALS — BP 113/80 | HR 89 | Temp 99.3°F | Ht 69.0 in | Wt 157.6 lb

## 2019-08-11 DIAGNOSIS — E1169 Type 2 diabetes mellitus with other specified complication: Secondary | ICD-10-CM | POA: Diagnosis not present

## 2019-08-11 DIAGNOSIS — E785 Hyperlipidemia, unspecified: Secondary | ICD-10-CM | POA: Diagnosis not present

## 2019-08-11 DIAGNOSIS — I10 Essential (primary) hypertension: Secondary | ICD-10-CM

## 2019-08-11 LAB — CMP14+EGFR
ALT: 30 IU/L (ref 0–44)
AST: 18 IU/L (ref 0–40)
Albumin/Globulin Ratio: 2.1 (ref 1.2–2.2)
Albumin: 4.5 g/dL (ref 3.8–4.8)
Alkaline Phosphatase: 84 IU/L (ref 39–117)
BUN/Creatinine Ratio: 10 (ref 10–24)
BUN: 10 mg/dL (ref 8–27)
Bilirubin Total: 0.5 mg/dL (ref 0.0–1.2)
CO2: 23 mmol/L (ref 20–29)
Calcium: 9.8 mg/dL (ref 8.6–10.2)
Chloride: 101 mmol/L (ref 96–106)
Creatinine, Ser: 1.03 mg/dL (ref 0.76–1.27)
GFR calc Af Amer: 90 mL/min/{1.73_m2} (ref >59)
GFR calc non Af Amer: 78 mL/min/{1.73_m2} (ref >59)
Globulin, Total: 2.1 g/dL (ref 1.5–4.5)
Glucose: 210 mg/dL — ABNORMAL HIGH (ref 65–99)
Potassium: 4.2 mmol/L (ref 3.5–5.2)
Sodium: 141 mmol/L (ref 134–144)
Total Protein: 6.6 g/dL (ref 6.0–8.5)

## 2019-08-11 LAB — CBC WITH DIFFERENTIAL/PLATELET
Basophils Absolute: 0.1 10*3/uL (ref 0.0–0.2)
Basos: 1 %
EOS (ABSOLUTE): 0.3 10*3/uL (ref 0.0–0.4)
Eos: 4 %
Hematocrit: 49.3 % (ref 37.5–51.0)
Hemoglobin: 15.7 g/dL (ref 13.0–17.7)
Immature Grans (Abs): 0 10*3/uL (ref 0.0–0.1)
Immature Granulocytes: 0 %
Lymphocytes Absolute: 2.2 10*3/uL (ref 0.7–3.1)
Lymphs: 27 %
MCH: 29.3 pg (ref 26.6–33.0)
MCHC: 31.8 g/dL (ref 31.5–35.7)
MCV: 92 fL (ref 79–97)
Monocytes Absolute: 0.7 10*3/uL (ref 0.1–0.9)
Monocytes: 8 %
Neutrophils Absolute: 5 10*3/uL (ref 1.4–7.0)
Neutrophils: 60 %
Platelets: 265 10*3/uL (ref 150–450)
RBC: 5.36 x10E6/uL (ref 4.14–5.80)
RDW: 12.1 % (ref 11.6–15.4)
WBC: 8.2 10*3/uL (ref 3.4–10.8)

## 2019-08-11 LAB — BAYER DCA HB A1C WAIVED: HB A1C (BAYER DCA - WAIVED): 12.9 % — ABNORMAL HIGH (ref <7.0)

## 2019-08-11 MED ORDER — METFORMIN HCL ER 750 MG PO TB24: 750.0000 mg | ORAL_TABLET | Freq: Two times a day (BID) | ORAL | 1 refills | Status: DC

## 2019-08-11 MED ORDER — OZEMPIC (1 MG/DOSE) 2 MG/1.5ML ~~LOC~~ SOPN: 2.0000 mg | PEN_INJECTOR | SUBCUTANEOUS | 3 refills | Status: DC

## 2019-08-11 NOTE — Patient Instructions (Signed)
Carbohydrate Counting for Diabetes Mellitus, Adult  Carbohydrate counting is a method of keeping track of how many carbohydrates you eat. Eating carbohydrates naturally increases the amount of sugar (glucose) in the blood. Counting how many carbohydrates you eat helps keep your blood glucose within normal limits, which helps you manage your diabetes (diabetes mellitus). It is important to know how many carbohydrates you can safely have in each meal. This is different for every person. A diet and nutrition specialist (registered dietitian) can help you make a meal plan and calculate how many carbohydrates you should have at each meal and snack. Carbohydrates are found in the following foods:  Grains, such as breads and cereals.  Dried beans and soy products.  Starchy vegetables, such as potatoes, peas, and corn.  Fruit and fruit juices.  Milk and yogurt.  Sweets and snack foods, such as cake, cookies, candy, chips, and soft drinks. How do I count carbohydrates? There are two ways to count carbohydrates in food. You can use either of the methods or a combination of both. Reading "Nutrition Facts" on packaged food The "Nutrition Facts" list is included on the labels of almost all packaged foods and beverages in the [place] It includes:  The serving size.  Information about nutrients in each serving, including the grams (g) of carbohydrate per serving. To use the "Nutrition Facts":  Decide how many servings you will have.  Multiply the number of servings by the number of carbohydrates per serving.  The resulting number is the total amount of carbohydrates that you will be having. Learning standard serving sizes of other foods When you eat carbohydrate foods that are not packaged or do not include "Nutrition Facts" on the label, you need to measure the servings in order to count the amount of carbohydrates:  Measure the foods that you will eat with a food scale or measuring cup, if  needed.  Decide how many standard-size servings you will eat.  Multiply the number of servings by 15. Most carbohydrate-rich foods have about 15 g of carbohydrates per serving. ? For example, if you eat 8 oz (170 g) of strawberries, you will have eaten 2 servings and 30 g of carbohydrates (2 servings x 15 g = 30 g).  For foods that have more than one food mixed, such as soups and casseroles, you must count the carbohydrates in each food that is included. The following list contains standard serving sizes of common carbohydrate-rich foods. Each of these servings has about 15 g of carbohydrates:   hamburger bun or  English muffin.   oz (15 mL) syrup.   oz (14 g) jelly.  1 slice of bread.  1 six-inch tortilla.  3 oz (85 g) cooked rice or pasta.  4 oz (113 g) cooked dried beans.  4 oz (113 g) starchy vegetable, such as peas, corn, or potatoes.  4 oz (113 g) hot cereal.  4 oz (113 g) mashed potatoes or  of a large baked potato.  4 oz (113 g) canned or frozen fruit.  4 oz (120 mL) fruit juice.  4-6 crackers.  6 chicken nuggets.  6 oz (170 g) unsweetened dry cereal.  6 oz (170 g) plain fat-free yogurt or yogurt sweetened with artificial sweeteners.  8 oz (240 mL) milk.  8 oz (170 g) fresh fruit or one small piece of fruit.  24 oz (680 g) popped popcorn. Example of carbohydrate counting Sample meal  3 oz (85 g) chicken breast.  6 oz (170 g)   brown rice.  4 oz (113 g) corn.  8 oz (240 mL) milk.  8 oz (170 g) strawberries with sugar-free whipped topping. Carbohydrate calculation 1. Identify the foods that contain carbohydrates: ? Rice. ? Corn. ? Milk. ? Strawberries. 2. Calculate how many servings you have of each food: ? 2 servings rice. ? 1 serving corn. ? 1 serving milk. ? 1 serving strawberries. 3. Multiply each number of servings by 15 g: ? 2 servings rice x 15 g = 30 g. ? 1 serving corn x 15 g = 15 g. ? 1 serving milk x 15 g = 15 g. ? 1  serving strawberries x 15 g = 15 g. 4. Add together all of the amounts to find the total grams of carbohydrates eaten: ? 30 g + 15 g + 15 g + 15 g = 75 g of carbohydrates total. Summary  Carbohydrate counting is a method of keeping track of how many carbohydrates you eat.  Eating carbohydrates naturally increases the amount of sugar (glucose) in the blood.  Counting how many carbohydrates you eat helps keep your blood glucose within normal limits, which helps you manage your diabetes.  A diet and nutrition specialist (registered dietitian) can help you make a meal plan and calculate how many carbohydrates you should have at each meal and snack. This information is not intended to replace advice given to you by your health care provider. Make sure you discuss any questions you have with your health care provider. Document Revised: 12/11/2016 Document Reviewed: 10/31/2015 Elsevier Patient Education  2020 Elsevier Inc.  

## 2019-08-11 NOTE — Progress Notes (Signed)
Subjective:  Patient ID: Jackson Booth, male    DOB: 1956/01/08  Age: 64 y.o. MRN: [id number]  CC: Follow-up (1 month)   HPI Jackson Booth presents forFollow-up of diabetes. Patient checks blood sugar at home.   200 fasting and 300 postprandial Patient denies symptoms such as polyuria, polydipsia, excessive hunger, nausea No significant hypoglycemic spells noted. Medications reviewed. Pt reports taking them regularly without complication/adverse reaction being reported today.  Checking feet daily.  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   He has a past surgical history that includes LEFT HEART CATH AND CORONARY ANGIOGRAPHY (N/A, 08/04/2016); CORONARY STENT INTERVENTION (N/A, 08/04/2016); Inguinal hernia repair (Right, 1986); Carpal tunnel release (Right, 1986); Nasal septum surgery (~ 1975; 1977); Coronary angioplasty with stent (; 08/04/2016; 08/07/2016); and CORONARY STENT INTERVENTION (N/A, 08/07/2016).   His family history includes Heart disease in his brother and mother; Hypertension in his father and mother.He reports that he has never smoked. He has never used smokeless tobacco. He reports that he does not drink alcohol or use drugs.  Current Outpatient Medications on File Prior to Visit  Medication Sig Dispense Refill  . aspirin 81 MG chewable tablet Chew 1 tablet (81 mg total) by mouth daily.    . benazepril (LOTENSIN) 40 MG tablet Take 1 tablet (40 mg total) by mouth every evening. Office visit needed-3RD AND FINAL ATTEMPT 15 tablet 0  . clopidogrel (PLAVIX) 75 MG tablet Take 1 tablet (75 mg total) by mouth daily. 90 tablet 3  . glipiZIDE (GLUCOTROL) 5 MG tablet Take 1 tablet (5  mg total) by mouth daily before breakfast. 90 tablet 2  . metoprolol succinate (TOPROL-XL) 25 MG 24 hr tablet Take 1 tablet (25 mg total) by mouth daily. 90 tablet 3  . nitroGLYCERIN (NITROSTAT) 0.4 MG SL tablet Place 1 tablet (0.4 mg total) under the tongue every 5 (five) minutes as needed for chest pain. 25 tablet 1  . rosuvastatin (CRESTOR) 40 MG tablet Take 1 tablet (40 mg total) by mouth daily. 90 tablet 3   No current facility-administered medications on file prior to visit.    ROS Review of Systems  Constitutional: Negative.   HENT: Negative.   Eyes: Negative for visual disturbance.  Respiratory: Negative for cough and shortness of breath.   Cardiovascular: Negative for chest pain and leg swelling.  Gastrointestinal: Negative for abdominal pain, diarrhea, nausea and vomiting.  Genitourinary: Negative for difficulty urinating.  Musculoskeletal: Negative for arthralgias and myalgias.  Skin: Negative for rash.  Neurological: Negative for headaches.  Psychiatric/Behavioral: Negative for sleep disturbance.    Objective:  BP 113/80   Pulse 89   Temp 99.3 F (37.4 C) (Temporal)   Ht _0  (1.753 m)   Wt 157 lb 9.6 oz (71.5 kg)   BMI 23.27 kg/m   BP Readings from Last 3 Encounters:  08/11/19 113/80  07/12/19 116/82  06/14/19 130/86    Wt Readings from Last 3 Encounters:  08/11/19 157 lb 9.6 oz (71.5 kg)  07/12/19 158 lb 12.8 oz (72 kg)  06/14/19 158 lb (71.7 kg)     Physical Exam Vitals reviewed.  Constitutional:      Appearance: He is well-developed.  HENT:     Head: Normocephalic and atraumatic.  Right Ear: External ear normal.     Left Ear: External ear normal.     Mouth/Throat:     Pharynx: No oropharyngeal exudate or posterior oropharyngeal erythema.  Eyes:     Pupils: Pupils are equal, round, and reactive to light.  Cardiovascular:     Rate and Rhythm: Normal rate and regular rhythm.     Heart sounds: No murmur.  Pulmonary:     Effort: No  respiratory distress.     Breath sounds: Normal breath sounds.  Musculoskeletal:     Cervical back: Normal range of motion and neck supple.  Neurological:     Mental Status: He is alert and oriented to person, place, and time.       Assessment & Plan:   Jackson Booth was seen today for follow-up.  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 -     metFORMIN (GLUCOPHAGE-XR) 750 MG 24 hr tablet; Take 1 tablet (750 mg total) by mouth 2 (two) times daily.  Essential hypertension -     CMP14+EGFR -     CBC with Differential/Platelet -     Bayer DCA Hb A1c Waived   Blood pressures under good control.  However, diabetes although it is improving from the 400 range we saw at onset of this regimen of treatment it still is much higher than that hoped for.  A1c is 12+ today.  That is down from greater than 14 3 months ago, however the response to the Ozempic is not as much aside hoped for.  As result I went ahead and increase the Ozempic to the maximum dose.  In addition he will be taking the Metformin XR 750 twice daily.  I also recommended that he add the glipizide back as well.  At this point I do not think monthly intervention is going to make a difference for him.  We discussed dietary compliance exercise etc. he knows what the plan is he will contact me if he needs anything sooner otherwise we can go to 74-monthfollow-ups for now.  I have changed Jackson Booth's Ozempic (1 MG/DOSE) and metFORMIN. I am also having him maintain his aspirin, metoprolol succinate, rosuvastatin, benazepril, clopidogrel, nitroGLYCERIN, and glipiZIDE.  Meds ordered this encounter  Medications  . Semaglutide, 1 MG/DOSE, (OZEMPIC, 1 MG/DOSE,) 2 MG/1.5ML SOPN    Sig: Inject 2 mg into the  skin once a week.    Dispense:  9 mL    Refill:  3  . metFORMIN (GLUCOPHAGE-XR) 750 MG 24 hr tablet    Sig: Take 1 tablet (750 mg total) by mouth 2 (two) times daily.    Dispense:  180 tablet    Refill:  1     Follow-up: Return in about 3 months (around 11/11/2019).  WClaretta Fraise M.D.

## 2019-08-12 ENCOUNTER — Encounter: Payer: Self-pay | Admitting: Family Medicine

## 2019-11-16 ENCOUNTER — Other Ambulatory Visit: Payer: Self-pay

## 2019-11-17 ENCOUNTER — Encounter: Payer: Self-pay | Admitting: Family Medicine

## 2019-11-17 ENCOUNTER — Ambulatory Visit (INDEPENDENT_AMBULATORY_CARE_PROVIDER_SITE_OTHER): Payer: 59 | Admitting: Family Medicine

## 2019-11-17 VITALS — BP 137/82 | HR 74 | Temp 97.6°F | Ht 69.0 in | Wt 160.6 lb

## 2019-11-17 DIAGNOSIS — E785 Hyperlipidemia, unspecified: Secondary | ICD-10-CM

## 2019-11-17 DIAGNOSIS — E1169 Type 2 diabetes mellitus with other specified complication: Secondary | ICD-10-CM

## 2019-11-17 DIAGNOSIS — Z1159 Encounter for screening for other viral diseases: Secondary | ICD-10-CM | POA: Diagnosis not present

## 2019-11-17 DIAGNOSIS — E782 Mixed hyperlipidemia: Secondary | ICD-10-CM

## 2019-11-17 DIAGNOSIS — I10 Essential (primary) hypertension: Secondary | ICD-10-CM | POA: Diagnosis not present

## 2019-11-17 DIAGNOSIS — Z114 Encounter for screening for human immunodeficiency virus [HIV]: Secondary | ICD-10-CM

## 2019-11-17 LAB — BAYER DCA HB A1C WAIVED: HB A1C (BAYER DCA - WAIVED): 9.4 % — ABNORMAL HIGH (ref <7.0)

## 2019-11-17 NOTE — Progress Notes (Signed)
Subjective:  Patient ID: Jackson Booth,  male    DOB: Jan 22, 1956  Age: 64 y.o.    CC: Follow-up (3 month)   HPI Jackson Booth presents for  follow-up of hypertension. Patient has no history of headache chest pain or shortness of breath or recent cough. Patient also denies symptoms of TIA such as numbness weakness lateralizing. Patient denies side effects from medication. States taking it regularly.  Patient also  in for follow-up of elevated cholesterol. Doing well without complaints on current medication. Denies side effects  including myalgia and arthralgia and nausea. Also in today for liver function testing. Currently no chest pain, shortness of breath or other cardiovascular related symptoms noted.  Follow-up of diabetes. Patient does check blood sugar at home. Log reviewed. Sent for scanning. Fasting Running 120-160. Postprandial are declining into the 150-200 range, but as recently as May were 200-300.  Patient denies symptoms such as excessive hunger or urinary frequency, excessive hunger, nausea No significant hypoglycemic spells noted. Medications reviewed. Pt reports taking them regularly. He has been taking the Ozempic every 10-12 days instead of weekly . This is due to the desire to not be dependent on medications. Pt. denies complication/adverse reaction to his medications.  History Jackson Booth has a past medical history of Anginal pain ([place] Booth), Anxiety, Arthritis, CAD S/P percutaneous coronary angioplasty (08/07/2016), Family history of adverse reaction to anesthesia, High cholesterol, History of vertigo, Hypertension, Inferolateral myocardial infarction ([place] Booth) (08/04/2016), Sinus headache, and Type II diabetes mellitus ([place] Booth).   He has a past surgical history that includes LEFT HEART CATH AND CORONARY ANGIOGRAPHY (N/A, 08/04/2016); CORONARY STENT INTERVENTION (N/A, 08/04/2016); Inguinal hernia repair (Right, 1986); Carpal tunnel release (Right, 1986); Nasal septum surgery (~ 1975; 1977);  Coronary angioplasty with stent (; 08/04/2016; 08/07/2016); and CORONARY STENT INTERVENTION (N/A, 08/07/2016).   His family history includes Heart disease in his brother and mother; Hypertension in his father and mother.He reports that he has never smoked. He has never used smokeless tobacco. He reports that he does not drink alcohol and does not use drugs.  Current Outpatient Medications on File Prior to Visit  Medication Sig Dispense Refill  . aspirin 81 MG chewable tablet Chew 1 tablet (81 mg total) by mouth daily.    . benazepril (LOTENSIN) 40 MG tablet Take 1 tablet (40 mg total) by mouth every evening. Office visit needed-3RD AND FINAL ATTEMPT 15 tablet 0  . clopidogrel (PLAVIX) 75 MG tablet Take 1 tablet (75 mg total) by mouth daily. 90 tablet 3  . glipiZIDE (GLUCOTROL) 5 MG tablet Take 1 tablet (5 mg total) by mouth daily before breakfast. 90 tablet 2  . metFORMIN (GLUCOPHAGE-XR) 750 MG 24 hr tablet Take 1 tablet (750 mg total) by mouth 2 (two) times daily. 180 tablet 1  . metoprolol succinate (TOPROL-XL) 25 MG 24 hr tablet Take 1 tablet (25 mg total) by mouth daily. 90 tablet 3  . rosuvastatin (CRESTOR) 40 MG tablet Take 1 tablet (40 mg total) by mouth daily. 90 tablet 3  . Semaglutide, 1 MG/DOSE, (OZEMPIC, 1 MG/DOSE,) 2 MG/1.5ML SOPN Inject 2 mg into the skin once a week. 9 mL 3  . nitroGLYCERIN (NITROSTAT) 0.4 MG SL tablet Place 1 tablet (0.4 mg total) under the tongue every 5 (five) minutes as needed for chest pain. (Patient not taking: Reported on 11/17/2019) 25 tablet 1   No current facility-administered medications on file prior to visit.    ROS Review of Systems  Constitutional: Negative.   HENT:  Negative.   Eyes: Negative for visual disturbance.  Respiratory: Negative for cough and shortness of breath.   Cardiovascular: Negative for chest pain and leg swelling.  Gastrointestinal: Negative for abdominal pain, diarrhea, nausea and vomiting.  Genitourinary: Negative for difficulty  urinating.  Musculoskeletal: Negative for arthralgias and myalgias.  Skin: Negative for rash.  Neurological: Negative for headaches.  Psychiatric/Behavioral: Negative for sleep disturbance.    Objective:  BP 137/82   Pulse 74   Temp 97.6 F (36.4 C) (Temporal)   Ht _0  (1.753 m)   Wt 160 lb 9.6 oz (72.8 kg)   BMI 23.72 kg/m   BP Readings from Last 3 Encounters:  11/17/19 137/82  08/11/19 113/80  07/12/19 116/82    Wt Readings from Last 3 Encounters:  11/17/19 160 lb 9.6 oz (72.8 kg)  08/11/19 157 lb 9.6 oz (71.5 kg)  07/12/19 158 lb 12.8 oz (72 kg)     Physical Exam Vitals reviewed.  Constitutional:      Appearance: He is well-developed.  HENT:     Head: Normocephalic and atraumatic.     Right Ear: External ear normal.     Left Ear: External ear normal.     Mouth/Throat:     Pharynx: No oropharyngeal exudate or posterior oropharyngeal erythema.  Eyes:     Pupils: Pupils are equal, round, and reactive to light.  Cardiovascular:     Rate and Rhythm: Normal rate and regular rhythm.     Heart sounds: No murmur heard.   Pulmonary:     Effort: No respiratory distress.     Breath sounds: Normal breath sounds.  Musculoskeletal:     Cervical back: Normal range of motion and neck supple.  Neurological:     Mental Status: He is alert and oriented to person, place, and time.     Diabetic Foot Exam - Simple   Simple Foot Form Diabetic Foot exam was performed with the following findings: Yes 11/17/2019  8:30 AM  Visual Inspection No deformities, no ulcerations, no other skin breakdown bilaterally: Yes Sensation Testing Intact to touch and monofilament testing bilaterally: Yes Pulse Check Posterior Tibialis and Dorsalis pulse intact bilaterally: Yes Comments       Assessment & Plan:   Jackson Booth was seen today for follow-up.  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 Esco maintain his aspirin, metoprolol succinate, rosuvastatin, benazepril, clopidogrel, nitroGLYCERIN, glipiZIDE, Ozempic (1 MG/DOSE), and metFORMIN.  No orders of the defined types were placed in this encounter.    Follow-up: Return in about 3 months (around 02/17/2020).  Claretta Fraise, M.D.

## 2019-11-18 LAB — CMP14+EGFR
ALT: 40 IU/L (ref 0–44)
AST: 23 IU/L (ref 0–40)
Albumin/Globulin Ratio: 2 (ref 1.2–2.2)
Albumin: 4.3 g/dL (ref 3.8–4.8)
Alkaline Phosphatase: 87 IU/L (ref 48–121)
BUN/Creatinine Ratio: 8 — ABNORMAL LOW (ref 10–24)
BUN: 9 mg/dL (ref 8–27)
Bilirubin Total: 0.5 mg/dL (ref 0.0–1.2)
CO2: 24 mmol/L (ref 20–29)
Calcium: 9.6 mg/dL (ref 8.6–10.2)
Chloride: 105 mmol/L (ref 96–106)
Creatinine, Ser: 1.1 mg/dL (ref 0.76–1.27)
GFR calc Af Amer: 83 mL/min/{1.73_m2} (ref >59)
GFR calc non Af Amer: 72 mL/min/{1.73_m2} (ref >59)
Globulin, Total: 2.1 g/dL (ref 1.5–4.5)
Glucose: 114 mg/dL — ABNORMAL HIGH (ref 65–99)
Potassium: 4.2 mmol/L (ref 3.5–5.2)
Sodium: 143 mmol/L (ref 134–144)
Total Protein: 6.4 g/dL (ref 6.0–8.5)

## 2019-11-18 LAB — LIPID PANEL
Chol/HDL Ratio: 2 ratio (ref 0.0–5.0)
Cholesterol, Total: 127 mg/dL (ref 100–199)
HDL: 64 mg/dL (ref >39)
LDL Chol Calc (NIH): 51 mg/dL (ref 0–99)
Triglycerides: 50 mg/dL (ref 0–149)
VLDL Cholesterol Cal: 12 mg/dL (ref 5–40)

## 2019-11-18 LAB — CBC WITH DIFFERENTIAL/PLATELET
Basophils Absolute: 0.1 10*3/uL (ref 0.0–0.2)
Basos: 1 %
EOS (ABSOLUTE): 0.4 10*3/uL (ref 0.0–0.4)
Eos: 6 %
Hematocrit: 44.9 % (ref 37.5–51.0)
Hemoglobin: 14.8 g/dL (ref 13.0–17.7)
Immature Grans (Abs): 0 10*3/uL (ref 0.0–0.1)
Immature Granulocytes: 0 %
Lymphocytes Absolute: 2 10*3/uL (ref 0.7–3.1)
Lymphs: 28 %
MCH: 29.7 pg (ref 26.6–33.0)
MCHC: 33 g/dL (ref 31.5–35.7)
MCV: 90 fL (ref 79–97)
Monocytes Absolute: 0.7 10*3/uL (ref 0.1–0.9)
Monocytes: 10 %
Neutrophils Absolute: 4 10*3/uL (ref 1.4–7.0)
Neutrophils: 55 %
Platelets: 252 10*3/uL (ref 150–450)
RBC: 4.99 x10E6/uL (ref 4.14–5.80)
RDW: 13.4 % (ref 11.6–15.4)
WBC: 7.1 10*3/uL (ref 3.4–10.8)

## 2019-11-18 LAB — MICROALBUMIN / CREATININE URINE RATIO
Creatinine, Urine: 177.3 mg/dL
Microalb/Creat Ratio: 6 mg/g creat (ref 0–29)
Microalbumin, Urine: 11 ug/mL

## 2019-11-18 LAB — HIV ANTIBODY (ROUTINE TESTING W REFLEX): HIV Screen 4th Generation wRfx: NONREACTIVE

## 2019-11-18 LAB — HEPATITIS C ANTIBODY: Hep C Virus Ab: <0.1 s/co ratio (ref 0.0–0.9)

## 2019-12-19 ENCOUNTER — Other Ambulatory Visit: Payer: Self-pay | Admitting: Family Medicine

## 2019-12-19 DIAGNOSIS — E785 Hyperlipidemia, unspecified: Secondary | ICD-10-CM

## 2020-01-25 LAB — HM DIABETES EYE EXAM

## 2020-02-20 ENCOUNTER — Encounter: Payer: Self-pay | Admitting: Family Medicine

## 2020-02-20 ENCOUNTER — Ambulatory Visit: Payer: 59 | Admitting: Family Medicine

## 2020-02-20 ENCOUNTER — Other Ambulatory Visit: Payer: Self-pay

## 2020-02-20 VITALS — BP 130/82 | HR 80 | Temp 97.4°F | Resp 20 | Ht 69.0 in | Wt 168.0 lb

## 2020-02-20 DIAGNOSIS — M25559 Pain in unspecified hip: Secondary | ICD-10-CM

## 2020-02-20 DIAGNOSIS — E782 Mixed hyperlipidemia: Secondary | ICD-10-CM

## 2020-02-20 DIAGNOSIS — E785 Hyperlipidemia, unspecified: Secondary | ICD-10-CM

## 2020-02-20 DIAGNOSIS — E1169 Type 2 diabetes mellitus with other specified complication: Secondary | ICD-10-CM | POA: Diagnosis not present

## 2020-02-20 DIAGNOSIS — I10 Essential (primary) hypertension: Secondary | ICD-10-CM | POA: Diagnosis not present

## 2020-02-20 LAB — BAYER DCA HB A1C WAIVED: HB A1C (BAYER DCA - WAIVED): 6.8 % (ref <7.0)

## 2020-02-20 MED ORDER — PREDNISONE 10 MG PO TABS: ORAL_TABLET | ORAL | 0 refills | Status: DC

## 2020-02-20 NOTE — Progress Notes (Signed)
Subjective:  Patient ID: Jackson Booth,  male    DOB: May 15, 1956  Age: 64 y.o.    CC: Medical Management of Chronic Issues   HPI SOHAM HOLLETT presents for  follow-up of hypertension. Patient has no history of headache chest pain or shortness of breath or recent cough. Patient also denies symptoms of TIA such as numbness weakness lateralizing. Patient denies side effects from medication. States taking it regularly.  Patient also  in for follow-up of elevated cholesterol. Doing well without complaints on current medication. Denies side effects  including myalgia and arthralgia and nausea. Also in today for liver function testing. Currently no chest pain, shortness of breath or other cardiovascular related symptoms noted.  Follow-up of diabetes. Patient does check blood sugar at home. Readings run between 100 and 170. His fasting readings are done only 3-4 hours after eating, so are truly random mostly. He is waking up in the night due to pain. Patient denies symptoms such as excessive hunger or urinary frequency, excessive hunger, nausea No significant hypoglycemic spells noted. Medications reviewed. Pt reports taking them regularly. Pt. denies complication/adverse reaction today.    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   He has a past surgical history that includes LEFT HEART CATH AND CORONARY ANGIOGRAPHY (N/A, 08/04/2016); CORONARY STENT INTERVENTION (N/A, 08/04/2016); Inguinal hernia repair (Right, 1986); Carpal tunnel release (Right, 1986); Nasal septum surgery (~ 1975; 1977); Coronary angioplasty with stent (; 08/04/2016; 08/07/2016); and CORONARY STENT INTERVENTION (N/A, 08/07/2016).    His family history includes Heart disease in his brother and mother; Hypertension in his father and mother.He reports that he has never smoked. He has never used smokeless tobacco. He reports that he does not drink alcohol and does not use drugs.  Current Outpatient Medications on File Prior to Visit  Medication Sig Dispense Refill  . aspirin 81 MG chewable tablet Chew 1 tablet (81 mg total) by mouth daily.    . clopidogrel (PLAVIX) 75 MG tablet Take 1 tablet (75 mg total) by mouth daily. 90 tablet 3  . glipiZIDE (GLUCOTROL) 5 MG tablet Take 1 tablet (5 mg total) by mouth daily before breakfast. 90 tablet 2  . metFORMIN (GLUCOPHAGE-XR) 750 MG 24 hr tablet TAKE 1 TABLET (750 MG TOTAL) BY MOUTH 2 (TWO) TIMES DAILY. 180 tablet 1  . metoprolol succinate (TOPROL-XL) 25 MG 24 hr tablet Take 1 tablet (25 mg total) by mouth daily. 90 tablet 3  . rosuvastatin (CRESTOR) 40 MG tablet Take 1 tablet (40 mg total) by mouth daily. 90 tablet 3  . Semaglutide, 1 MG/DOSE, (OZEMPIC, 1 MG/DOSE,) 2 MG/1.5ML SOPN Inject 2 mg into the skin once a week. 9 mL 3  . benazepril (LOTENSIN) 40 MG tablet Take 1 tablet (40 mg total) by mouth every evening. Office visit needed-3RD AND FINAL ATTEMPT (Patient not taking: Reported on 02/20/2020) 15 tablet 0  . nitroGLYCERIN (NITROSTAT) 0.4 MG SL tablet Place 1 tablet (0.4 mg total) under the tongue every 5 (five) minutes as needed for chest pain. (Patient not taking: Reported on 11/17/2019) 25 tablet 1   No current facility-administered medications on file prior to visit.    ROS Review of Systems  Constitutional: Negative for fever.  Respiratory: Negative for shortness of breath.   Cardiovascular:  Negative for chest pain.  Musculoskeletal: Positive for arthralgias (strained right hip 3-4 weeks ago while piling brush on a trailer. Step was to high. Now awakening at noight in pain).  Skin: Negative for rash.    Objective:  BP 130/82   Pulse 80   Temp (!) 97.4 F (36.3 C)  (Temporal)   Resp 20   Ht 5' 9"  (1.753 m)   Wt 168 lb (76.2 kg)   SpO2 99%   BMI 24.81 kg/m   BP Readings from Last 3 Encounters:  02/20/20 130/82  11/17/19 137/82  08/11/19 113/80    Wt Readings from Last 3 Encounters:  02/20/20 168 lb (76.2 kg)  11/17/19 160 lb 9.6 oz (72.8 kg)  08/11/19 157 lb 9.6 oz (71.5 kg)     Physical Exam  Diabetic Foot Exam - Simple   No data filed        Assessment & Plan:   Dominic was seen today for medical management of chronic issues.  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   I am having Waverly G. Cecilie Lowers start on predniSONE. I am also having him maintain his aspirin, metoprolol succinate, rosuvastatin, benazepril, clopidogrel, nitroGLYCERIN, glipiZIDE, Ozempic (1 MG/DOSE), and metFORMIN.  Meds ordered this encounter  Medications  . predniSONE (DELTASONE) 10 MG tablet    Sig: Take 5 daily for 2 days followed by 4,3,2 and 1 for 2 days each.    Dispense:  30 tablet    Refill:  0     Follow-up: Return in about 3 months (around 05/21/2020).  Claretta Fraise, M.D.

## 2020-02-21 LAB — CMP14+EGFR
ALT: 34 IU/L (ref 0–44)
AST: 27 IU/L (ref 0–40)
Albumin/Globulin Ratio: 2.3 — ABNORMAL HIGH (ref 1.2–2.2)
Albumin: 4.5 g/dL (ref 3.8–4.8)
Alkaline Phosphatase: 71 IU/L (ref 44–121)
BUN/Creatinine Ratio: 9 — ABNORMAL LOW (ref 10–24)
BUN: 11 mg/dL (ref 8–27)
Bilirubin Total: 0.4 mg/dL (ref 0.0–1.2)
CO2: 25 mmol/L (ref 20–29)
Calcium: 9.8 mg/dL (ref 8.6–10.2)
Chloride: 104 mmol/L (ref 96–106)
Creatinine, Ser: 1.19 mg/dL (ref 0.76–1.27)
GFR calc Af Amer: 75 mL/min/{1.73_m2} (ref >59)
GFR calc non Af Amer: 65 mL/min/{1.73_m2} (ref >59)
Globulin, Total: 2 g/dL (ref 1.5–4.5)
Glucose: 200 mg/dL — ABNORMAL HIGH (ref 65–99)
Potassium: 4.3 mmol/L (ref 3.5–5.2)
Sodium: 141 mmol/L (ref 134–144)
Total Protein: 6.5 g/dL (ref 6.0–8.5)

## 2020-02-21 LAB — CBC WITH DIFFERENTIAL/PLATELET
Basophils Absolute: 0.1 10*3/uL (ref 0.0–0.2)
Basos: 1 %
EOS (ABSOLUTE): 0.5 10*3/uL — ABNORMAL HIGH (ref 0.0–0.4)
Eos: 8 %
Hematocrit: 44.3 % (ref 37.5–51.0)
Hemoglobin: 14.4 g/dL (ref 13.0–17.7)
Immature Grans (Abs): 0 10*3/uL (ref 0.0–0.1)
Immature Granulocytes: 0 %
Lymphocytes Absolute: 1.8 10*3/uL (ref 0.7–3.1)
Lymphs: 25 %
MCH: 29.6 pg (ref 26.6–33.0)
MCHC: 32.5 g/dL (ref 31.5–35.7)
MCV: 91 fL (ref 79–97)
Monocytes Absolute: 0.6 10*3/uL (ref 0.1–0.9)
Monocytes: 8 %
Neutrophils Absolute: 4.2 10*3/uL (ref 1.4–7.0)
Neutrophils: 58 %
Platelets: 228 10*3/uL (ref 150–450)
RBC: 4.86 x10E6/uL (ref 4.14–5.80)
RDW: 12.8 % (ref 11.6–15.4)
WBC: 7.1 10*3/uL (ref 3.4–10.8)

## 2020-02-21 NOTE — Progress Notes (Signed)
Hello [place],  Your lab result is normal and/or stable.Some minor variations that are not significant are commonly marked abnormal, but do not represent any medical problem for you.  Best regards, Melane Windholz, M.D.

## 2020-04-17 ENCOUNTER — Other Ambulatory Visit: Payer: Self-pay | Admitting: Family Medicine

## 2020-04-17 DIAGNOSIS — E785 Hyperlipidemia, unspecified: Secondary | ICD-10-CM

## 2020-04-17 DIAGNOSIS — E1169 Type 2 diabetes mellitus with other specified complication: Secondary | ICD-10-CM

## 2020-04-24 ENCOUNTER — Other Ambulatory Visit: Payer: Self-pay | Admitting: Family Medicine

## 2020-04-24 DIAGNOSIS — I10 Essential (primary) hypertension: Secondary | ICD-10-CM

## 2020-04-24 DIAGNOSIS — E1169 Type 2 diabetes mellitus with other specified complication: Secondary | ICD-10-CM

## 2020-04-24 MED ORDER — BENAZEPRIL HCL 40 MG PO TABS: 40.0000 mg | ORAL_TABLET | Freq: Every evening | ORAL | 1 refills | Status: DC

## 2020-05-17 ENCOUNTER — Ambulatory Visit: Payer: 59 | Admitting: Family Medicine

## 2020-05-17 ENCOUNTER — Encounter: Payer: Self-pay | Admitting: Family Medicine

## 2020-05-17 ENCOUNTER — Other Ambulatory Visit: Payer: Self-pay

## 2020-05-17 VITALS — BP 155/97 | HR 73 | Temp 97.2°F

## 2020-05-17 DIAGNOSIS — E785 Hyperlipidemia, unspecified: Secondary | ICD-10-CM

## 2020-05-17 DIAGNOSIS — E782 Mixed hyperlipidemia: Secondary | ICD-10-CM | POA: Diagnosis not present

## 2020-05-17 DIAGNOSIS — I10 Essential (primary) hypertension: Secondary | ICD-10-CM

## 2020-05-17 DIAGNOSIS — E1169 Type 2 diabetes mellitus with other specified complication: Secondary | ICD-10-CM | POA: Diagnosis not present

## 2020-05-17 LAB — CBC WITH DIFFERENTIAL/PLATELET
Basophils Absolute: 0.1 10*3/uL (ref 0.0–0.2)
Basos: 1 %
EOS (ABSOLUTE): 0.5 10*3/uL — ABNORMAL HIGH (ref 0.0–0.4)
Eos: 7 %
Hematocrit: 47.5 % (ref 37.5–51.0)
Hemoglobin: 16.1 g/dL (ref 13.0–17.7)
Immature Grans (Abs): 0 10*3/uL (ref 0.0–0.1)
Immature Granulocytes: 0 %
Lymphocytes Absolute: 2.1 10*3/uL (ref 0.7–3.1)
Lymphs: 28 %
MCH: 29.7 pg (ref 26.6–33.0)
MCHC: 33.9 g/dL (ref 31.5–35.7)
MCV: 88 fL (ref 79–97)
Monocytes Absolute: 0.8 10*3/uL (ref 0.1–0.9)
Monocytes: 10 %
Neutrophils Absolute: 4 10*3/uL (ref 1.4–7.0)
Neutrophils: 54 %
Platelets: 260 10*3/uL (ref 150–450)
RBC: 5.42 x10E6/uL (ref 4.14–5.80)
RDW: 13.1 % (ref 11.6–15.4)
WBC: 7.4 10*3/uL (ref 3.4–10.8)

## 2020-05-17 LAB — CMP14+EGFR
ALT: 31 IU/L (ref 0–44)
AST: 20 IU/L (ref 0–40)
Albumin/Globulin Ratio: 2 (ref 1.2–2.2)
Albumin: 4.5 g/dL (ref 3.8–4.8)
Alkaline Phosphatase: 80 IU/L (ref 44–121)
BUN/Creatinine Ratio: 10 (ref 10–24)
BUN: 10 mg/dL (ref 8–27)
Bilirubin Total: 0.4 mg/dL (ref 0.0–1.2)
CO2: 25 mmol/L (ref 20–29)
Calcium: 9.9 mg/dL (ref 8.6–10.2)
Chloride: 101 mmol/L (ref 96–106)
Creatinine, Ser: 1 mg/dL (ref 0.76–1.27)
GFR calc Af Amer: 93 mL/min/{1.73_m2} (ref >59)
GFR calc non Af Amer: 80 mL/min/{1.73_m2} (ref >59)
Globulin, Total: 2.3 g/dL (ref 1.5–4.5)
Glucose: 151 mg/dL — ABNORMAL HIGH (ref 65–99)
Potassium: 4.8 mmol/L (ref 3.5–5.2)
Sodium: 139 mmol/L (ref 134–144)
Total Protein: 6.8 g/dL (ref 6.0–8.5)

## 2020-05-17 LAB — BAYER DCA HB A1C WAIVED: HB A1C (BAYER DCA - WAIVED): 7.6 % — ABNORMAL HIGH (ref <7.0)

## 2020-05-17 LAB — LIPID PANEL
Chol/HDL Ratio: 2.4 ratio (ref 0.0–5.0)
Cholesterol, Total: 164 mg/dL (ref 100–199)
HDL: 68 mg/dL (ref >39)
LDL Chol Calc (NIH): 78 mg/dL (ref 0–99)
Triglycerides: 99 mg/dL (ref 0–149)
VLDL Cholesterol Cal: 18 mg/dL (ref 5–40)

## 2020-05-19 NOTE — Progress Notes (Signed)
Hello Tawfiq,  Your lab result is normal and/or stable.Some minor variations that are not significant are commonly marked abnormal, but do not represent any medical problem for you.  Best regards, Cormac Wint, M.D.

## 2020-05-24 ENCOUNTER — Encounter: Payer: Self-pay | Admitting: Family Medicine

## 2020-05-24 NOTE — Progress Notes (Signed)
Subjective:  Patient ID: Jackson Booth, male    DOB: 11-10-1955  Age: 64 y.o. MRN: [id number]  CC: Medical Management of Chronic Issues   HPI RANDI COLLEGE presents forFollow-up of diabetes. Patient checks blood sugar at home.   130-160 mostly fasting and wide ranging from low 100s to low 300s, but many 200-250 postprandial Patient denies symptoms such as polyuria, polydipsia, excessive hunger, nausea No significant hypoglycemic spells noted. Medications reviewed. Pt reports taking them regularly without complication/adverse reaction being reported today.  Checking feet daily.   presents for  follow-up of hypertension. Patient has no history of headache chest pain or shortness of breath or recent cough. Patient also denies symptoms of TIA such as focal numbness or weakness. Patient denies side effects from medication. States taking it regularly.  in for follow-up of elevated cholesterol. Doing well without complaints on current medication. Denies side effects of statin including myalgia and arthralgia and nausea. Currently no chest pain, shortness of breath or other cardiovascular related symptoms noted.    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   Jackson Booth has a past surgical history that includes LEFT HEART CATH AND CORONARY ANGIOGRAPHY (N/A, 08/04/2016); CORONARY STENT INTERVENTION (N/A, 08/04/2016); Inguinal hernia repair (Right, 1986); Carpal tunnel release (Right, 1986); Nasal septum surgery (~ 1975; 1977); Coronary angioplasty with stent (; 08/04/2016; 08/07/2016); and CORONARY STENT INTERVENTION (N/A, 08/07/2016).   His family history includes Heart disease in his brother and mother;  Hypertension in his father and mother.Jackson Booth reports that Jackson Booth has never smoked. Jackson Booth has never used smokeless tobacco. Jackson Booth reports that Jackson Booth does not drink alcohol and does not use drugs.  Current Outpatient Medications on File Prior to Visit  Medication Sig Dispense Refill  . aspirin 81 MG chewable tablet Chew 1 tablet (81 mg total) by mouth daily.    . benazepril (LOTENSIN) 40 MG tablet Take 1 tablet (40 mg total) by mouth every evening. 90 tablet 1  . clopidogrel (PLAVIX) 75 MG tablet Take 1 tablet (75 mg total) by mouth daily. 90 tablet 3  . glipiZIDE (GLUCOTROL) 5 MG tablet TAKE 1 TABLET (5 MG TOTAL) BY MOUTH DAILY BEFORE BREAKFAST. 90 tablet 0  . metFORMIN (GLUCOPHAGE-XR) 750 MG 24 hr tablet TAKE 1 TABLET (750 MG TOTAL) BY MOUTH 2 (TWO) TIMES DAILY. 180 tablet 1  . metoprolol succinate (TOPROL-XL) 25 MG 24 hr tablet Take 1 tablet (25 mg total) by mouth daily. 90 tablet 3  . nitroGLYCERIN (NITROSTAT) 0.4 MG SL tablet Place 1 tablet (0.4 mg total) under the tongue every 5 (five) minutes as needed for chest pain. (Patient not taking: Reported on 11/17/2019) 25 tablet 1  . predniSONE (DELTASONE) 10 MG tablet Take 5 daily for 2 days followed by 4,3,2 and 1 for 2 days each. 30 tablet 0  . rosuvastatin (CRESTOR) 40 MG tablet Take 1 tablet (40 mg total) by mouth daily. 90 tablet 3  . Semaglutide, 1 MG/DOSE, (OZEMPIC, 1 MG/DOSE,) 2 MG/1.5ML SOPN Inject 2 mg into the skin once a week. 9 mL 3   No current facility-administered medications on file prior to visit.    ROS Review of Systems  Constitutional: Negative for fever.  Respiratory: Negative for shortness of breath.   Cardiovascular: Negative for chest pain.  Musculoskeletal:  Negative for arthralgias.  Skin: Negative for rash.    Objective:  BP (!) 155/97   Pulse 73   Temp (!) 97.2 F (36.2 C) (Temporal)   BP Readings from Last 3 Encounters:  05/17/20 (!) 155/97  02/20/20 130/82  11/17/19 137/82    Wt Readings from Last 3 Encounters:   02/20/20 168 lb (76.2 kg)  11/17/19 160 lb 9.6 oz (72.8 kg)  08/11/19 157 lb 9.6 oz (71.5 kg)     Physical Exam Vitals reviewed.  Constitutional:      Appearance: Jackson Booth is well-developed and well-nourished.  HENT:     Head: Normocephalic and atraumatic.     Right Ear: Tympanic membrane and external ear normal. No decreased hearing noted.     Left Ear: Tympanic membrane and external ear normal. No decreased hearing noted.     Mouth/Throat:     Pharynx: No oropharyngeal exudate or posterior oropharyngeal erythema.  Eyes:     Pupils: Pupils are equal, round, and reactive to light.  Cardiovascular:     Rate and Rhythm: Normal rate and regular rhythm.     Heart sounds: No murmur heard.   Pulmonary:     Effort: No respiratory distress.     Breath sounds: Normal breath sounds.  Abdominal:     General: Bowel sounds are normal.     Palpations: Abdomen is soft. There is no mass.     Tenderness: There is no abdominal tenderness.  Musculoskeletal:     Cervical back: Normal range of motion and neck supple.       Assessment & Plan:   Jackson Booth was seen today for medical management of chronic issues.  Diagnoses and all orders for this visit:  Essential hypertension -     Cancel: Hemoglobin A1c -     CBC with Differential/Platelet -     CMP14+EGFR -     Lipid panel  DM type 2 with diabetic dyslipidemia (HCC) -     Cancel: Hemoglobin A1c -     CBC with Differential/Platelet -     CMP14+EGFR -     Lipid panel -     Bayer DCA Hb A1c Waived  Mixed hyperlipidemia -     Cancel: Hemoglobin A1c -     CBC with Differential/Platelet -     CMP14+EGFR -     Lipid panel      I am having Jackson Booth maintain his aspirin, metoprolol succinate, rosuvastatin, clopidogrel, nitroGLYCERIN, Ozempic (1 MG/DOSE), metFORMIN, predniSONE, glipiZIDE, and benazepril.  No orders of the defined types were placed in this encounter.    Follow-up: Return in about 3 months (around  08/15/2020).  Claretta Fraise, M.D.

## 2020-06-19 ENCOUNTER — Other Ambulatory Visit: Payer: Self-pay | Admitting: Family Medicine

## 2020-06-19 DIAGNOSIS — E782 Mixed hyperlipidemia: Secondary | ICD-10-CM

## 2020-06-19 DIAGNOSIS — I2510 Atherosclerotic heart disease of native coronary artery without angina pectoris: Secondary | ICD-10-CM

## 2020-06-19 DIAGNOSIS — I10 Essential (primary) hypertension: Secondary | ICD-10-CM

## 2020-07-22 ENCOUNTER — Other Ambulatory Visit: Payer: Self-pay | Admitting: Family Medicine

## 2020-07-22 DIAGNOSIS — E782 Mixed hyperlipidemia: Secondary | ICD-10-CM

## 2020-07-22 DIAGNOSIS — E785 Hyperlipidemia, unspecified: Secondary | ICD-10-CM

## 2020-07-22 DIAGNOSIS — E1169 Type 2 diabetes mellitus with other specified complication: Secondary | ICD-10-CM

## 2020-07-22 DIAGNOSIS — I2510 Atherosclerotic heart disease of native coronary artery without angina pectoris: Secondary | ICD-10-CM

## 2020-07-22 DIAGNOSIS — I10 Essential (primary) hypertension: Secondary | ICD-10-CM

## 2020-08-01 ENCOUNTER — Other Ambulatory Visit: Payer: Self-pay | Admitting: Family Medicine

## 2020-08-01 DIAGNOSIS — I10 Essential (primary) hypertension: Secondary | ICD-10-CM

## 2020-08-01 DIAGNOSIS — E785 Hyperlipidemia, unspecified: Secondary | ICD-10-CM

## 2020-08-01 DIAGNOSIS — E1169 Type 2 diabetes mellitus with other specified complication: Secondary | ICD-10-CM

## 2020-08-03 ENCOUNTER — Ambulatory Visit: Payer: 59 | Admitting: Cardiovascular Disease

## 2020-08-03 ENCOUNTER — Encounter: Payer: Self-pay | Admitting: Cardiovascular Disease

## 2020-08-03 ENCOUNTER — Other Ambulatory Visit: Payer: Self-pay

## 2020-08-03 DIAGNOSIS — I10 Essential (primary) hypertension: Secondary | ICD-10-CM | POA: Diagnosis not present

## 2020-08-03 DIAGNOSIS — I2121 ST elevation (STEMI) myocardial infarction involving left circumflex coronary artery: Secondary | ICD-10-CM

## 2020-08-03 DIAGNOSIS — E782 Mixed hyperlipidemia: Secondary | ICD-10-CM

## 2020-08-03 NOTE — Assessment & Plan Note (Signed)
History of essential hypertension blood pressure measured today at 132/88.  He is on benazepril and metoprolol.

## 2020-08-03 NOTE — Patient Instructions (Signed)

## 2020-08-03 NOTE — Progress Notes (Signed)
08/03/2020 [name]   10/01/1955  [id number]  Primary Physician Jackson Claude, MD Primary Cardiologist: Jackson Gess MD Jackson Booth, [place], [place]  HPI:  Jackson Booth is a 65 y.o. mildly overweight single Caucasian male who lives in [place]. His risk factors include treated hypertension and diabetes. He has a strong family history of heart disease. He's had no prior cardiac history.I last saw him in the office  06/08/2019. He developed substernal chest pain relating to his jaw and left upper extremity at approximately noon today. He called EMS. His initial EKG showed ST segment elevation inferolaterally. He is admitted with inferolateral ST segment elevation and was brought urgently to the cardiac catheterization laboratory where radial cath revealed an occluded nondominant but fairly large circumflex, high-grade proximal moderate size diagonal branch disease and 80% mid to distal dominant RCA stenosis and a posterolateral wall motion abnormality. He underwent PCI and drug-eluting stenting of his circumflex coronary artery with a balloon time of 21 minutes. His peak troponin was 36. His ejection fraction was 50-55% by 2-D echo.He underwent staged mid dominant RCA PCI and drug-eluting stenting 3 days later and he was discharged home on 08/08/16.   Since I saw him a year and a half ago he is remained stable.    His major complaint is of back pain.  Apparently when I saw him last he lost 30 pounds probably related to diabetes out-of-control which is since addressed.  He denies chest pain or shortness of breath.  He remains on dual antiplatelet therapy.   Current Meds  Medication Sig  . aspirin 81 MG chewable tablet Chew 1 tablet (81 mg total) by mouth daily.  . benazepril (LOTENSIN) 40 MG tablet TAKE 1 TABLET BY MOUTH EVERY DAY IN THE EVENING  . clopidogrel (PLAVIX) 75 MG tablet TAKE 1 TABLET BY MOUTH EVERY DAY  . glipiZIDE (GLUCOTROL) 5 MG tablet TAKE 1 TABLET (5 MG TOTAL) BY  MOUTH DAILY BEFORE BREAKFAST.  . metFORMIN (GLUCOPHAGE-XR) 750 MG 24 hr tablet TAKE 1 TABLET (750 MG TOTAL) BY MOUTH 2 (TWO) TIMES DAILY.  . metoprolol succinate (TOPROL-XL) 25 MG 24 hr tablet TAKE 1 TABLET BY MOUTH EVERY DAY  . Misc Natural Products (GLUCOSAMINE CHOND CMP DOUBLE PO) Take by mouth daily.  . Multiple Vitamin (MULTIVITAMIN ADULT PO) Take by mouth daily.  . rosuvastatin (CRESTOR) 40 MG tablet TAKE 1 TABLET BY MOUTH EVERY DAY  . Semaglutide, 1 MG/DOSE, (OZEMPIC, 1 MG/DOSE,) 2 MG/1.5ML SOPN Inject 2 mg into the skin once a week.     Allergies  Allergen Reactions  . Carrot [Daucus Carota] Swelling    PT IS ALLERGIC TO RAW CARROTS   . Pecan Nut (Diagnostic) Itching and Swelling    Tongue swells  . Tape Rash    Social History   Socioeconomic History  . Marital status: Single    Spouse name: Not on file  . Number of children: Not on file  . Years of education: Not on file  . Highest education level: Not on file  Occupational History  . Not on file  Tobacco Use  . Smoking status: Never Smoker  . Smokeless tobacco: Never Used  Vaping Use  . Vaping Use: Never used  Substance and Sexual Activity  . Alcohol use: No  . Drug use: No  . Sexual activity: Never  Other Topics Concern  . Not on file  Social History Narrative  . Not on file   Social Determinants of  Health   Financial Resource Strain: Not on file  Food Insecurity: Not on file  Transportation Needs: Not on file  Physical Activity: Not on file  Stress: Not on file  Social Connections: Not on file  Intimate Partner Violence: Not on file     Review of Systems: General: negative for chills, fever, night sweats or weight changes.  Cardiovascular: negative for chest pain, dyspnea on exertion, edema, orthopnea, palpitations, paroxysmal nocturnal dyspnea or shortness of breath Dermatological: negative for rash Respiratory: negative for cough or wheezing Urologic: negative for hematuria Abdominal: negative  for nausea, vomiting, diarrhea, bright red blood per rectum, melena, or hematemesis Neurologic: negative for visual changes, syncope, or dizziness All other systems reviewed and are otherwise negative except as noted above.    Blood pressure 132/88, pulse 91, height 5\' 9"  (1.753 m), weight 164 lb 14.4 oz (74.8 kg), SpO2 99 %.  General appearance: alert and no distress Neck: no adenopathy, no carotid bruit, no JVD, supple, symmetrical, trachea midline and thyroid not enlarged, symmetric, no tenderness/mass/nodules Lungs: clear to auscultation bilaterally Heart: regular rate and rhythm, S1, S2 normal, no murmur, click, rub or gallop Extremities: extremities normal, atraumatic, no cyanosis or edema Pulses: 2+ and symmetric Skin: Skin color, texture, turgor normal. No rashes or lesions Neurologic: Alert and oriented X 3, normal strength and tone. Normal symmetric reflexes. Normal coordination and gait  EKG sinus rhythm at 91 with inferior Q waves and early R wave progression with lateral Q waves as well.  I personally reviewed this EKG.  ASSESSMENT AND PLAN:   Acute ST elevation myocardial infarction (STEMI) involving left circumflex coronary artery without development of Q waves (HCC) History of CAD status post lateral STEMI 08/04/2016.  PCI drug-eluting stenting of the mid nondominant circumflex with a door to balloon time of 21 minutes.  His peak troponin was 36.  His EF is well-preserved with a inferoposterolateral wall motion abnormality.  3 days later he underwent staged mid dominant RCA PCI drug-eluting stenting.  He remains on dual antiplatelet therapy.  He denies chest pain or shortness of breath.  HTN (hypertension) History of essential hypertension blood pressure measured today at 132/88.  He is on benazepril and metoprolol.  HLD (hyperlipidemia) Patient hyperlipidemia on statin therapy lipid profile performed 05/17/2020 revealing total cholesterol 164, LDL 78 and HDL  68.      05/19/2020 MD FACP,FACC,FAHA, [facility] 08/03/2020 8:14 AM

## 2020-08-03 NOTE — Assessment & Plan Note (Signed)
History of CAD status post lateral STEMI 08/04/2016.  PCI drug-eluting stenting of the mid nondominant circumflex with a door to balloon time of 21 minutes.  His peak troponin was 36.  His EF is well-preserved with a inferoposterolateral wall motion abnormality.  3 days later he underwent staged mid dominant RCA PCI drug-eluting stenting.  He remains on dual antiplatelet therapy.  He denies chest pain or shortness of breath.

## 2020-08-03 NOTE — Assessment & Plan Note (Signed)
Patient hyperlipidemia on statin therapy lipid profile performed 05/17/2020 revealing total cholesterol 164, LDL 78 and HDL 68.

## 2020-08-20 ENCOUNTER — Ambulatory Visit (INDEPENDENT_AMBULATORY_CARE_PROVIDER_SITE_OTHER): Payer: 59

## 2020-08-20 ENCOUNTER — Ambulatory Visit: Payer: 59 | Admitting: Family Medicine

## 2020-08-20 ENCOUNTER — Encounter: Payer: Self-pay | Admitting: Family Medicine

## 2020-08-20 ENCOUNTER — Other Ambulatory Visit: Payer: Self-pay

## 2020-08-20 VITALS — BP 124/82 | HR 94 | Temp 98.2°F | Ht 69.0 in | Wt 160.6 lb

## 2020-08-20 DIAGNOSIS — E1169 Type 2 diabetes mellitus with other specified complication: Secondary | ICD-10-CM | POA: Diagnosis not present

## 2020-08-20 DIAGNOSIS — M25552 Pain in left hip: Secondary | ICD-10-CM

## 2020-08-20 DIAGNOSIS — M545 Low back pain, unspecified: Secondary | ICD-10-CM | POA: Diagnosis not present

## 2020-08-20 DIAGNOSIS — J301 Allergic rhinitis due to pollen: Secondary | ICD-10-CM

## 2020-08-20 DIAGNOSIS — E785 Hyperlipidemia, unspecified: Secondary | ICD-10-CM

## 2020-08-20 DIAGNOSIS — M2550 Pain in unspecified joint: Secondary | ICD-10-CM

## 2020-08-20 DIAGNOSIS — Z23 Encounter for immunization: Secondary | ICD-10-CM | POA: Diagnosis not present

## 2020-08-20 DIAGNOSIS — M25561 Pain in right knee: Secondary | ICD-10-CM

## 2020-08-20 DIAGNOSIS — M5442 Lumbago with sciatica, left side: Secondary | ICD-10-CM | POA: Diagnosis not present

## 2020-08-20 DIAGNOSIS — M25562 Pain in left knee: Secondary | ICD-10-CM | POA: Diagnosis not present

## 2020-08-20 DIAGNOSIS — G8929 Other chronic pain: Secondary | ICD-10-CM

## 2020-08-20 LAB — BAYER DCA HB A1C WAIVED: HB A1C (BAYER DCA - WAIVED): 7.7 % — ABNORMAL HIGH (ref <7.0)

## 2020-08-20 MED ORDER — DAPAGLIFLOZIN PROPANEDIOL 10 MG PO TABS: 10.0000 mg | ORAL_TABLET | Freq: Every day | ORAL | 3 refills | Status: DC

## 2020-08-20 MED ORDER — CYCLOBENZAPRINE HCL 10 MG PO TABS: 10.0000 mg | ORAL_TABLET | Freq: Three times a day (TID) | ORAL | 1 refills | Status: DC | PRN

## 2020-08-20 MED ORDER — FEXOFENADINE-PSEUDOEPHED ER 180-240 MG PO TB24: 1.0000 | ORAL_TABLET | Freq: Every evening | ORAL | 11 refills | Status: DC

## 2020-08-20 MED ORDER — CELECOXIB 200 MG PO CAPS: 200.0000 mg | ORAL_CAPSULE | Freq: Every day | ORAL | 5 refills | Status: DC

## 2020-08-20 NOTE — Patient Instructions (Signed)

## 2020-08-20 NOTE — Addendum Note (Signed)
Addended by: Adella Hare B on: 08/20/2020 09:01 AM   Modules accepted: Orders

## 2020-08-20 NOTE — Progress Notes (Signed)
Subjective:  Patient ID: Jackson Booth,  male    DOB: 11/08/1955  Age: 65 y.o.    CC: No chief complaint on file.   HPI Jackson Booth presents for  follow-up of hypertension. Patient has no history of headache chest pain or shortness of breath or recent cough. Patient also denies symptoms of TIA such as numbness weakness lateralizing. Patient denies side effects from medication. States taking it regularly.  Patient also  in for follow-up of elevated cholesterol. Doing well without complaints on current medication. Denies side effects  including myalgia and arthralgia and nausea. Also in today for liver function testing. Currently no chest pain, shortness of breath or other cardiovascular related symptoms noted.  Follow-up of diabetes. Patient does check blood sugar at home. Readings run between 150-175 fasting  and 175-250 prandial Patient denies symptoms such as excessive hunger or urinary frequency, excessive hunger, nausea No significant hypoglycemic spells noted. Medications reviewed. Pt reports taking them regularly. Pt. denies complication/adverse reaction today.   Back pain since January. Also left leg and knees keep him awake at night. Not exercising.  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   He has a past surgical history that includes LEFT HEART CATH AND CORONARY ANGIOGRAPHY (N/A, 08/04/2016); CORONARY STENT INTERVENTION (N/A, 08/04/2016); Inguinal hernia repair (Right, 1986); Carpal tunnel release (Right, 1986); Nasal septum surgery (~ 1975; 1977); Coronary angioplasty with stent (; 08/04/2016; 08/07/2016); and CORONARY STENT INTERVENTION (N/A, 08/07/2016).   His family history  includes Heart disease in his brother and mother; Hypertension in his father and mother.He reports that he has never smoked. He has never used smokeless tobacco. He reports that he does not drink alcohol and does not use drugs.  Current Outpatient Medications on File Prior to Visit  Medication Sig Dispense Refill  . aspirin 81 MG chewable tablet Chew 1 tablet (81 mg total) by mouth daily.    . benazepril (LOTENSIN) 40 MG tablet TAKE 1 TABLET BY MOUTH EVERY DAY IN THE EVENING 90 tablet 0  . clopidogrel (PLAVIX) 75 MG tablet TAKE 1 TABLET BY MOUTH EVERY DAY 90 tablet 0  . glipiZIDE (GLUCOTROL) 5 MG tablet TAKE 1 TABLET (5 MG TOTAL) BY MOUTH DAILY BEFORE BREAKFAST. 90 tablet 0  . metoprolol succinate (TOPROL-XL) 25 MG 24 hr tablet TAKE 1 TABLET BY MOUTH EVERY DAY 90 tablet 0  . Misc Natural Products (GLUCOSAMINE CHOND CMP DOUBLE PO) Take by mouth daily.    . Multiple Vitamin (MULTIVITAMIN ADULT PO) Take by mouth daily.    . nitroGLYCERIN (NITROSTAT) 0.4 MG SL tablet Place 1 tablet (0.4 mg total) under the tongue every 5 (five) minutes as needed for chest pain. 25 tablet 1  . rosuvastatin (CRESTOR) 40 MG tablet TAKE 1 TABLET BY MOUTH EVERY DAY 90 tablet 0  . Semaglutide, 1 MG/DOSE, (OZEMPIC, 1 MG/DOSE,) 2 MG/1.5ML SOPN Inject 2 mg into the skin once a week. 9 mL 3   No current facility-administered medications on file prior to visit.    ROS Review of Systems  Constitutional: Negative for fever.  HENT: Positive for congestion and rhinorrhea. Negative for sneezing.   Respiratory: Negative for shortness of breath.   Cardiovascular: Negative for chest pain.  Musculoskeletal: Positive for arthralgias and back pain.  Skin:  Negative for rash.    Objective:  BP 124/82   Pulse 94   Temp 98.2 F (36.8 C)   Ht 5' 9" (1.753 m)   Wt 160 lb 9.6 oz (72.8 kg)   SpO2 97%   BMI 23.72 kg/m   BP Readings from Last 3 Encounters:  08/20/20 124/82  08/03/20 132/88  05/17/20 (!) 155/97    Wt  Readings from Last 3 Encounters:  08/20/20 160 lb 9.6 oz (72.8 kg)  08/03/20 164 lb 14.4 oz (74.8 kg)  02/20/20 168 lb (76.2 kg)     Physical Exam Constitutional:      General: He is not in acute distress.    Appearance: He is well-developed.  HENT:     Head: Normocephalic and atraumatic.     Right Ear: Tympanic membrane and external ear normal.     Left Ear: Tympanic membrane and external ear normal.     Nose: Congestion and rhinorrhea present.  Eyes:     Conjunctiva/sclera: Conjunctivae normal.     Pupils: Pupils are equal, round, and reactive to light.  Cardiovascular:     Rate and Rhythm: Normal rate and regular rhythm.     Heart sounds: Normal heart sounds. No murmur heard.   Pulmonary:     Effort: Pulmonary effort is normal. No respiratory distress.     Breath sounds: Normal breath sounds. No wheezing or rales.  Abdominal:     Palpations: Abdomen is soft.     Tenderness: There is no abdominal tenderness.  Musculoskeletal:        General: Tenderness (left lumbar musculature) present. Normal range of motion.     Cervical back: Normal range of motion and neck supple.  Skin:    General: Skin is warm and dry.  Neurological:     Mental Status: He is alert and oriented to person, place, and time.     Deep Tendon Reflexes: Reflexes are normal and symmetric.  Psychiatric:        Behavior: Behavior normal.        Thought Content: Thought content normal.        Judgment: Judgment normal.     Diabetic Foot Exam - Simple   No data filed       Assessment & Plan:   Diagnoses and all orders for this visit:  DM type 2 with diabetic dyslipidemia ([place]) -     Bayer DCA Hb A1c Waived -     CBC with Differential/Platelet -     CMP14+EGFR  Arthralgia, unspecified joint -     DG Knee 1-2 Views Right; Future -     DG Knee 1-2 Views Left; Future -     DG HIP UNILAT W OR W/O PELVIS 2-3 VIEWS LEFT; Future -     DG Lumbar Spine 2-3 Views; Future  Chronic left-sided low  back pain with left-sided sciatica  Chronic pain of both knees  Seasonal allergic rhinitis due to pollen  Other orders -     dapagliflozin propanediol (FARXIGA) 10 MG TABS tablet; Take 1 tablet (10 mg total) by mouth daily before breakfast. -     celecoxib (CELEBREX) 200 MG capsule; Take 1 capsule (200 mg total) by mouth daily. With food -     cyclobenzaprine (FLEXERIL) 10 MG tablet; Take 1 tablet (10 mg total) by mouth 3 (three) times daily as needed for muscle spasms. -     fexofenadine-pseudoephedrine (ALLEGRA-D 24) 180-240 MG 24 hr tablet; Take 1 tablet by mouth  every evening. For allergy and congestion   I have discontinued Jsaon G. Rizzi's metFORMIN. I am also having him start on dapagliflozin propanediol, celecoxib, cyclobenzaprine, and fexofenadine-pseudoephedrine. Additionally, I am having him maintain his aspirin, nitroGLYCERIN, Ozempic (1 MG/DOSE), metoprolol succinate, rosuvastatin, glipiZIDE, clopidogrel, benazepril, Misc Natural Products (GLUCOSAMINE CHOND CMP DOUBLE PO), and Multiple Vitamin (MULTIVITAMIN ADULT PO).  Meds ordered this encounter  Medications  . dapagliflozin propanediol (FARXIGA) 10 MG TABS tablet    Sig: Take 1 tablet (10 mg total) by mouth daily before breakfast.    Dispense:  90 tablet    Refill:  3  . celecoxib (CELEBREX) 200 MG capsule    Sig: Take 1 capsule (200 mg total) by mouth daily. With food    Dispense:  30 capsule    Refill:  5  . cyclobenzaprine (FLEXERIL) 10 MG tablet    Sig: Take 1 tablet (10 mg total) by mouth 3 (three) times daily as needed for muscle spasms.    Dispense:  90 tablet    Refill:  1  . fexofenadine-pseudoephedrine (ALLEGRA-D 24) 180-240 MG 24 hr tablet    Sig: Take 1 tablet by mouth every evening. For allergy and congestion    Dispense:  30 tablet    Refill:  11     Follow-up: Return in about 2 weeks (around 09/03/2020).  Claretta Fraise, M.D.

## 2020-08-21 LAB — CBC WITH DIFFERENTIAL/PLATELET
Basophils Absolute: 0.1 10*3/uL (ref 0.0–0.2)
Basos: 1 %
EOS (ABSOLUTE): 0.3 10*3/uL (ref 0.0–0.4)
Eos: 3 %
Hematocrit: 50.6 % (ref 37.5–51.0)
Hemoglobin: 17 g/dL (ref 13.0–17.7)
Immature Grans (Abs): 0 10*3/uL (ref 0.0–0.1)
Immature Granulocytes: 0 %
Lymphocytes Absolute: 1.6 10*3/uL (ref 0.7–3.1)
Lymphs: 18 %
MCH: 30 pg (ref 26.6–33.0)
MCHC: 33.6 g/dL (ref 31.5–35.7)
MCV: 89 fL (ref 79–97)
Monocytes Absolute: 0.8 10*3/uL (ref 0.1–0.9)
Monocytes: 9 %
Neutrophils Absolute: 5.9 10*3/uL (ref 1.4–7.0)
Neutrophils: 69 %
Platelets: 259 10*3/uL (ref 150–450)
RBC: 5.67 x10E6/uL (ref 4.14–5.80)
RDW: 13.1 % (ref 11.6–15.4)
WBC: 8.7 10*3/uL (ref 3.4–10.8)

## 2020-08-21 LAB — CMP14+EGFR
ALT: 36 IU/L (ref 0–44)
AST: 26 IU/L (ref 0–40)
Albumin/Globulin Ratio: 2.2 (ref 1.2–2.2)
Albumin: 4.8 g/dL (ref 3.8–4.8)
Alkaline Phosphatase: 103 IU/L (ref 44–121)
BUN/Creatinine Ratio: 6 — ABNORMAL LOW (ref 10–24)
BUN: 7 mg/dL — ABNORMAL LOW (ref 8–27)
Bilirubin Total: 0.5 mg/dL (ref 0.0–1.2)
CO2: 24 mmol/L (ref 20–29)
Calcium: 9.9 mg/dL (ref 8.6–10.2)
Chloride: 100 mmol/L (ref 96–106)
Creatinine, Ser: 1.19 mg/dL (ref 0.76–1.27)
Globulin, Total: 2.2 g/dL (ref 1.5–4.5)
Glucose: 200 mg/dL — ABNORMAL HIGH (ref 65–99)
Potassium: 4.6 mmol/L (ref 3.5–5.2)
Sodium: 140 mmol/L (ref 134–144)
Total Protein: 7 g/dL (ref 6.0–8.5)
eGFR: 69 mL/min/1.73

## 2020-08-21 NOTE — Progress Notes (Signed)
Pt returning call about x ray  

## 2020-08-21 NOTE — Progress Notes (Signed)
Hello Burnie,  Your lab result is normal and/or stable.Some minor variations that are not significant are commonly marked abnormal, but do not represent any medical problem for you.  Best regards, Yobana Culliton, M.D.

## 2020-09-05 ENCOUNTER — Other Ambulatory Visit: Payer: Self-pay

## 2020-09-05 ENCOUNTER — Ambulatory Visit: Payer: 59 | Admitting: Family Medicine

## 2020-09-05 ENCOUNTER — Encounter: Payer: Self-pay | Admitting: Family Medicine

## 2020-09-05 VITALS — BP 117/78 | HR 92 | Temp 97.7°F | Ht 69.0 in | Wt 159.2 lb

## 2020-09-05 DIAGNOSIS — M5442 Lumbago with sciatica, left side: Secondary | ICD-10-CM

## 2020-09-05 DIAGNOSIS — G8929 Other chronic pain: Secondary | ICD-10-CM | POA: Diagnosis not present

## 2020-09-05 NOTE — Progress Notes (Signed)
Subjective:  Patient ID: Jackson Booth, male    DOB: 1956-03-10  Age: 65 y.o. MRN: [id number]  CC: Follow-up   HPI Jackson Booth presents for aching a lot still. Not that bad. Lower left lumbar. KNees better. Nerve down lateral left  thigh has constant ache is 2/10. A little sore.  He is doing the exercises as reviewed before.  He is finished medication.  He can continue the Celebrex  Depression screen [facility] 2/9 09/05/2020 08/20/2020 02/20/2020  Decreased Interest 0 0 0  Down, Depressed, Hopeless 0 0 0  PHQ - 2 Score 0 0 0  Altered sleeping - - -  Tired, decreased energy - - -  Change in appetite - - -  Feeling bad or failure about yourself  - - -  Trouble concentrating - - -  Moving slowly or fidgety/restless - - -  Suicidal thoughts - - -  PHQ-9 Score - - -    History Jackson Booth has a past medical history of Anginal pain (HCC), Anxiety, Arthritis, CAD S/P percutaneous coronary angioplasty (08/07/2016), Family history of adverse reaction to anesthesia, High cholesterol, History of vertigo, Hypertension, 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   He has a past surgical history that includes LEFT HEART CATH AND CORONARY ANGIOGRAPHY (N/A, 08/04/2016); CORONARY STENT INTERVENTION (N/A, 08/04/2016); Inguinal hernia repair (Right, 1986); Carpal tunnel release (Right, 1986); Nasal septum surgery (~ 1975; 1977); Coronary angioplasty with stent (; 08/04/2016; 08/07/2016); and CORONARY STENT INTERVENTION (N/A, 08/07/2016).   His family history includes Heart disease in his brother and mother; Hypertension in his father and mother.He reports that he has never smoked. He has never used smokeless tobacco. He reports that he does not drink alcohol and does not use drugs.    ROS Review of Systems  Constitutional: Positive for activity change (Getting close to normal again). Negative for fatigue and fever.  Musculoskeletal: Positive for  arthralgias and myalgias. Negative for gait problem and joint swelling.    Objective:  BP 117/78   Pulse 92   Temp 97.7 F (36.5 C)   Ht 5\' 9"  (1.753 m)   Wt 159 lb 3.2 oz (72.2 kg)   SpO2 99%   BMI 23.51 kg/m   BP Readings from Last 3 Encounters:  09/05/20 117/78  08/20/20 124/82  08/03/20 132/88    Wt Readings from Last 3 Encounters:  09/05/20 159 lb 3.2 oz (72.2 kg)  08/20/20 160 lb 9.6 oz (72.8 kg)  08/03/20 164 lb 14.4 oz (74.8 kg)     Physical Exam Vitals reviewed.  Constitutional:      Appearance: He is well-developed.  HENT:     Head: Normocephalic and atraumatic.     Right Ear: External ear normal.     Left Ear: External ear normal.     Mouth/Throat:     Pharynx: No oropharyngeal exudate or posterior oropharyngeal erythema.  Eyes:     Pupils: Pupils are equal, round, and reactive to light.  Cardiovascular:     Rate and Rhythm: Normal rate and regular rhythm.     Heart sounds: No murmur heard.   Pulmonary:     Effort: No respiratory distress.     Breath sounds: Normal breath sounds.  Musculoskeletal:        General: No tenderness. Normal range of motion.     Cervical back: Normal range of motion and neck supple.  Neurological:     Mental Status: He is alert  and oriented to person, place, and time.       Assessment & Plan:   Jackson Booth was seen today for follow-up.  Diagnoses and all orders for this visit:  Chronic left-sided low back pain with left-sided sciatica       I have discontinued Deeann Saint. Furches's cyclobenzaprine. I am also having him maintain his aspirin, nitroGLYCERIN, Ozempic (1 MG/DOSE), metoprolol succinate, rosuvastatin, glipiZIDE, clopidogrel, benazepril, Misc Natural Products (GLUCOSAMINE CHOND CMP DOUBLE PO), Multiple Vitamin (MULTIVITAMIN ADULT PO), dapagliflozin propanediol, celecoxib, and fexofenadine-pseudoephedrine.  Allergies as of 09/05/2020      Reactions   Carrot [daucus Carota] Swelling   PT IS ALLERGIC TO RAW  CARROTS    Pecan Nut (diagnostic) Itching, Swelling   Tongue swells   Tape Rash      Medication List       Accurate as of September 05, 2020  8:57 AM. If you have any questions, ask your nurse or doctor.        STOP taking these medications   cyclobenzaprine 10 MG tablet Commonly known as: FLEXERIL Stopped by: Mechele Claude, MD     TAKE these medications   aspirin 81 MG chewable tablet Chew 1 tablet (81 mg total) by mouth daily.   benazepril 40 MG tablet Commonly known as: LOTENSIN TAKE 1 TABLET BY MOUTH EVERY DAY IN THE EVENING   celecoxib 200 MG capsule Commonly known as: CeleBREX Take 1 capsule (200 mg total) by mouth daily. With food   clopidogrel 75 MG tablet Commonly known as: PLAVIX TAKE 1 TABLET BY MOUTH EVERY DAY   dapagliflozin propanediol 10 MG Tabs tablet Commonly known as: Farxiga Take 1 tablet (10 mg total) by mouth daily before breakfast.   fexofenadine-pseudoephedrine 180-240 MG 24 hr tablet Commonly known as: ALLEGRA-D 24 Take 1 tablet by mouth every evening. For allergy and congestion   glipiZIDE 5 MG tablet Commonly known as: GLUCOTROL TAKE 1 TABLET (5 MG TOTAL) BY MOUTH DAILY BEFORE BREAKFAST.   GLUCOSAMINE CHOND CMP DOUBLE PO Take by mouth daily.   metoprolol succinate 25 MG 24 hr tablet Commonly known as: TOPROL-XL TAKE 1 TABLET BY MOUTH EVERY DAY   MULTIVITAMIN ADULT PO Take by mouth daily.   nitroGLYCERIN 0.4 MG SL tablet Commonly known as: NITROSTAT Place 1 tablet (0.4 mg total) under the tongue every 5 (five) minutes as needed for chest pain.   Ozempic (1 MG/DOSE) 2 MG/1.5ML Sopn Generic drug: Semaglutide (1 MG/DOSE) Inject 2 mg into the skin once a week.   rosuvastatin 40 MG tablet Commonly known as: CRESTOR TAKE 1 TABLET BY MOUTH EVERY DAY      Symptoms are currently mild.  He should go to just Tylenol and continue exercises and heat.  Notify the office if symptoms worsen.  Follow-up: Return in about 3 months (around  12/05/2020).  Mechele Claude, M.D.

## 2020-10-26 ENCOUNTER — Other Ambulatory Visit: Payer: Self-pay | Admitting: Family Medicine

## 2020-10-26 DIAGNOSIS — E1169 Type 2 diabetes mellitus with other specified complication: Secondary | ICD-10-CM

## 2020-11-03 ENCOUNTER — Other Ambulatory Visit: Payer: Self-pay | Admitting: Family Medicine

## 2020-11-03 DIAGNOSIS — I10 Essential (primary) hypertension: Secondary | ICD-10-CM

## 2020-11-03 DIAGNOSIS — E785 Hyperlipidemia, unspecified: Secondary | ICD-10-CM

## 2020-11-03 DIAGNOSIS — E1169 Type 2 diabetes mellitus with other specified complication: Secondary | ICD-10-CM

## 2020-11-03 DIAGNOSIS — I2510 Atherosclerotic heart disease of native coronary artery without angina pectoris: Secondary | ICD-10-CM

## 2020-11-03 DIAGNOSIS — E782 Mixed hyperlipidemia: Secondary | ICD-10-CM

## 2020-12-05 ENCOUNTER — Other Ambulatory Visit: Payer: Self-pay

## 2020-12-05 ENCOUNTER — Other Ambulatory Visit: Payer: Self-pay | Admitting: Family Medicine

## 2020-12-05 ENCOUNTER — Ambulatory Visit: Payer: 59 | Admitting: Family Medicine

## 2020-12-05 ENCOUNTER — Encounter: Payer: Self-pay | Admitting: Family Medicine

## 2020-12-05 VITALS — BP 115/75 | HR 80 | Temp 97.8°F | Ht 69.0 in | Wt 167.0 lb

## 2020-12-05 DIAGNOSIS — I10 Essential (primary) hypertension: Secondary | ICD-10-CM

## 2020-12-05 DIAGNOSIS — Z23 Encounter for immunization: Secondary | ICD-10-CM | POA: Diagnosis not present

## 2020-12-05 DIAGNOSIS — E1169 Type 2 diabetes mellitus with other specified complication: Secondary | ICD-10-CM

## 2020-12-05 DIAGNOSIS — E785 Hyperlipidemia, unspecified: Secondary | ICD-10-CM

## 2020-12-05 DIAGNOSIS — E782 Mixed hyperlipidemia: Secondary | ICD-10-CM | POA: Diagnosis not present

## 2020-12-05 DIAGNOSIS — I2510 Atherosclerotic heart disease of native coronary artery without angina pectoris: Secondary | ICD-10-CM | POA: Diagnosis not present

## 2020-12-05 LAB — BAYER DCA HB A1C WAIVED: HB A1C (BAYER DCA - WAIVED): 7.6 % — ABNORMAL HIGH (ref <7.0)

## 2020-12-05 MED ORDER — OZEMPIC (1 MG/DOSE) 2 MG/1.5ML ~~LOC~~ SOPN: 2.0000 mg | PEN_INJECTOR | SUBCUTANEOUS | 3 refills | Status: DC

## 2020-12-05 MED ORDER — BENAZEPRIL HCL 40 MG PO TABS: ORAL_TABLET | ORAL | 3 refills | Status: DC

## 2020-12-05 MED ORDER — CLOPIDOGREL BISULFATE 75 MG PO TABS: 75.0000 mg | ORAL_TABLET | Freq: Every day | ORAL | 3 refills | Status: DC

## 2020-12-05 MED ORDER — METOPROLOL SUCCINATE ER 25 MG PO TB24: 25.0000 mg | ORAL_TABLET | Freq: Every day | ORAL | 3 refills | Status: DC

## 2020-12-05 MED ORDER — GLIPIZIDE 5 MG PO TABS: 5.0000 mg | ORAL_TABLET | Freq: Every day | ORAL | 3 refills | Status: DC

## 2020-12-05 MED ORDER — ROSUVASTATIN CALCIUM 40 MG PO TABS: 40.0000 mg | ORAL_TABLET | Freq: Every day | ORAL | 3 refills | Status: DC

## 2020-12-05 NOTE — Progress Notes (Addendum)
Subjective:  Patient ID: Jackson Booth, male    DOB: 10-13-1955  Age: 65 y.o. MRN: [id number]  CC: Medical Management of Chronic Issues   HPI Jackson Booth presents forFollow-up of diabetes.  Patient brings in a log of readings of his blood sugar.  We discussed the fact that the readings seem to be trending upward particularly in the fasting parameter over the last month.  It does not matter what it is.  He is not depressed or suicidal, but he is ready to die if it is his time and he does not want to monitor for this.  Patient denies symptoms such as polyuria, polydipsia, excessive hunger, nausea No significant hypoglycemic spells noted. Medications reviewed. Pt reports taking them regularly without complication/adverse reaction being reported today.    History Jackson Booth has a past medical history of Anginal pain ([place]), Anxiety, Arthritis, CAD S/P percutaneous coronary angioplasty (08/07/2016), Family history of adverse reaction to anesthesia, High cholesterol, History of vertigo, Hypertension, Inferolateral myocardial infarction ([place]) (08/04/2016), Sinus headache, and Type II diabetes mellitus ([place]).   He has a past surgical history that includes LEFT HEART CATH AND CORONARY ANGIOGRAPHY (N/A, 08/04/2016); CORONARY STENT INTERVENTION (N/A, 08/04/2016); Inguinal hernia repair (Right, 1986); Carpal tunnel release (Right, 1986); Nasal septum surgery (~ 1975; 1977); Coronary angioplasty with stent (; 08/04/2016; 08/07/2016); and CORONARY STENT INTERVENTION (N/A, 08/07/2016).   His family history includes Heart disease in his brother and mother; Hypertension in his father and mother.He reports that he has never smoked. He has never used smokeless tobacco. He reports that he does not drink alcohol and does not use drugs.  Current Outpatient Medications on File Prior to Visit  Medication Sig Dispense Refill   aspirin 81 MG chewable tablet Chew 1 tablet (81 mg total) by mouth daily.     celecoxib (CELEBREX) 200  MG capsule Take 1 capsule (200 mg total) by mouth daily. With food 30 capsule 5   dapagliflozin propanediol (FARXIGA) 10 MG TABS tablet Take 1 tablet (10 mg total) by mouth daily before breakfast. 90 tablet 3   fexofenadine-pseudoephedrine (ALLEGRA-D 24) 180-240 MG 24 hr tablet Take 1 tablet by mouth every evening. For allergy and congestion 30 tablet 11   Misc Natural Products (GLUCOSAMINE CHOND CMP DOUBLE PO) Take by mouth daily.     Multiple Vitamin (MULTIVITAMIN ADULT PO) Take by mouth daily.     nitroGLYCERIN (NITROSTAT) 0.4 MG SL tablet Place 1 tablet (0.4 mg total) under the tongue every 5 (five) minutes as needed for chest pain. 25 tablet 1   No current facility-administered medications on file prior to visit.    ROS Review of Systems  Constitutional:  Negative for fever.  Respiratory:  Negative for shortness of breath.   Cardiovascular:  Negative for chest pain.  Musculoskeletal:  Negative for arthralgias.  Skin:  Negative for rash.   Objective:  BP 115/75   Pulse 80   Temp 97.8 F (36.6 C)   Ht _0  (1.753 m)   Wt 167 lb (75.8 kg)   SpO2 97%   BMI 24.66 kg/m   BP Readings from Last 3 Encounters:  12/05/20 115/75  09/05/20 117/78  08/20/20 124/82    Wt Readings from Last 3 Encounters:  12/05/20 167 lb (75.8 kg)  09/05/20 159 lb 3.2 oz (72.2 kg)  08/20/20 160 lb 9.6 oz (72.8 kg)     Physical Exam Vitals reviewed.  Constitutional:      Appearance: He is well-developed.  HENT:  Head: Normocephalic and atraumatic.     Right Ear: External ear normal.     Left Ear: External ear normal.     Mouth/Throat:     Pharynx: No oropharyngeal exudate or posterior oropharyngeal erythema.  Eyes:     Pupils: Pupils are equal, round, and reactive to light.  Cardiovascular:     Rate and Rhythm: Normal rate and regular rhythm.     Heart sounds: No murmur heard. Pulmonary:     Effort: No respiratory distress.     Breath sounds: Normal breath sounds.  Musculoskeletal:      Cervical back: Normal range of motion and neck supple.  Neurological:     Mental Status: He is alert and oriented to person, place, and time.    Diabetic Foot Exam - Simple   Simple Foot Form Diabetic Foot exam was performed with the following findings: Yes 12/05/2020  8:31 AM  Visual Inspection No deformities, no ulcerations, no other skin breakdown bilaterally: Yes Sensation Testing Intact to touch and monofilament testing bilaterally: Yes Pulse Check Posterior Tibialis and Dorsalis pulse intact bilaterally: Yes Comments      Assessment & Plan:   Jackson Booth was seen today for medical management of chronic issues.  Diagnoses and all orders for this visit:  Essential hypertension -     CMP14+EGFR -     benazepril (LOTENSIN) 40 MG tablet; TAKE 1 TABLET BY MOUTH EVERY DAY IN THE EVENING -     metoprolol succinate (TOPROL-XL) 25 MG 24 hr tablet; Take 1 tablet (25 mg total) by mouth daily.  Mixed hyperlipidemia -     Lipid panel -     rosuvastatin (CRESTOR) 40 MG tablet; Take 1 tablet (40 mg total) by mouth daily.  DM type 2 with diabetic dyslipidemia (HCC) -     Bayer DCA Hb A1c Waived -     CBC with Differential/Platelet -     benazepril (LOTENSIN) 40 MG tablet; TAKE 1 TABLET BY MOUTH EVERY DAY IN THE EVENING -     glipiZIDE (GLUCOTROL) 5 MG tablet; Take 1 tablet (5 mg total) by mouth daily before breakfast. -     Semaglutide, 1 MG/DOSE, (OZEMPIC, 1 MG/DOSE,) 2 MG/1.5ML SOPN; Inject 2 mg into the skin once a week.  Coronary artery disease involving native coronary artery of native heart without angina pectoris -     clopidogrel (PLAVIX) 75 MG tablet; Take 1 tablet (75 mg total) by mouth daily. -     metoprolol succinate (TOPROL-XL) 25 MG 24 hr tablet; Take 1 tablet (25 mg total) by mouth daily. -     rosuvastatin (CRESTOR) 40 MG tablet; Take 1 tablet (40 mg total) by mouth daily.  Need for shingles vaccine -     Varicella-zoster vaccine IM (Shingrix)     I have changed  Jackson Booth's clopidogrel, glipiZIDE, metoprolol succinate, and rosuvastatin. I am also having him maintain his aspirin, nitroGLYCERIN, Misc Natural Products (GLUCOSAMINE CHOND CMP DOUBLE PO), Multiple Vitamin (MULTIVITAMIN ADULT PO), dapagliflozin propanediol, celecoxib, fexofenadine-pseudoephedrine, benazepril, and Ozempic (1 MG/DOSE).  Meds ordered this encounter  Medications   benazepril (LOTENSIN) 40 MG tablet    Sig: TAKE 1 TABLET BY MOUTH EVERY DAY IN THE EVENING    Dispense:  90 tablet    Refill:  3   clopidogrel (PLAVIX) 75 MG tablet    Sig: Take 1 tablet (75 mg total) by mouth daily.    Dispense:  90 tablet    Refill:  3  glipiZIDE (GLUCOTROL) 5 MG tablet    Sig: Take 1 tablet (5 mg total) by mouth daily before breakfast.    Dispense:  90 tablet    Refill:  3   metoprolol succinate (TOPROL-XL) 25 MG 24 hr tablet    Sig: Take 1 tablet (25 mg total) by mouth daily.    Dispense:  90 tablet    Refill:  3   rosuvastatin (CRESTOR) 40 MG tablet    Sig: Take 1 tablet (40 mg total) by mouth daily.    Dispense:  90 tablet    Refill:  3   Semaglutide, 1 MG/DOSE, (OZEMPIC, 1 MG/DOSE,) 2 MG/1.5ML SOPN    Sig: Inject 2 mg into the skin once a week.    Dispense:  9 mL    Refill:  3      Follow-up: Return in about 3 months (around 03/07/2021).  Claretta Fraise, M.D.

## 2020-12-05 NOTE — Patient Instructions (Signed)
https://www.diabeteseducator.org/docs/default-source/living-with-diabetes/conquering-the-grocery-store-v1.pdf?sfvrsn=4">  Carbohydrate Counting for Diabetes Mellitus, Adult Carbohydrate counting is a method of keeping track of how many carbohydrates you eat. Eating carbohydrates naturally increases the amount of sugar (glucose) in the blood. Counting how many carbohydrates you eat improves your bloodglucose control, which helps you manage your diabetes. It is important to know how many carbohydrates you can safely have in each meal. This is different for every person. A dietitian can help you make a meal plan and calculate how many carbohydrates you should have at each meal andsnack. What foods contain carbohydrates? Carbohydrates are found in the following foods: Grains, such as breads and cereals. Dried beans and soy products. Starchy vegetables, such as potatoes, peas, and corn. Fruit and fruit juices. Milk and yogurt. Sweets and snack foods, such as cake, cookies, candy, chips, and soft drinks. How do I count carbohydrates in foods? There are two ways to count carbohydrates in food. You can read food labels or learn standard serving sizes of foods. You can use either of the methods or acombination of both. Using the Nutrition Facts label The Nutrition Facts list is included on the labels of almost all packaged foods and beverages in the [place] It includes: The serving size. Information about nutrients in each serving, including the grams (g) of carbohydrate per serving. To use the Nutrition Facts: Decide how many servings you will have. Multiply the number of servings by the number of carbohydrates per serving. The resulting number is the total amount of carbohydrates that you will be having. Learning the standard serving sizes of foods When you eat carbohydrate foods that are not packaged or do not include Nutrition Facts on the label, you need to measure the servings in order to count the  amount of carbohydrates. Measure the foods that you will eat with a food scale or measuring cup, if needed. Decide how many standard-size servings you will eat. Multiply the number of servings by 15. For foods that contain carbohydrates, one serving equals 15 g of carbohydrates. For example, if you eat 2 cups or 10 oz (300 g) of strawberries, you will have eaten 2 servings and 30 g of carbohydrates (2 servings x 15 g = 30 g). For foods that have more than one food mixed, such as soups and casseroles, you must count the carbohydrates in each food that is included. The following list contains standard serving sizes of common carbohydrate-rich foods. Each of these servings has about 15 g of carbohydrates: 1 slice of bread. 1 six-inch (15 cm) tortilla. ? cup or 2 oz (53 g) cooked rice or pasta.  cup or 3 oz (85 g) cooked or canned, drained and rinsed beans or lentils.  cup or 3 oz (85 g) starchy vegetable, such as peas, corn, or squash.  cup or 4 oz (120 g) hot cereal.  cup or 3 oz (85 g) boiled or mashed potatoes, or  or 3 oz (85 g) of a large baked potato.  cup or 4 fl oz (118 mL) fruit juice. 1 cup or 8 fl oz (237 mL) milk. 1 small or 4 oz (106 g) apple.  or 2 oz (63 g) of a medium banana. 1 cup or 5 oz (150 g) strawberries. 3 cups or 1 oz (24 g) popped popcorn. What is an example of carbohydrate counting? To calculate the number of carbohydrates in this sample meal, follow the stepsshown below. Sample meal 3 oz (85 g) chicken breast. ? cup or 4 oz (106 g) brown rice.    cup or 3 oz (85 g) corn. 1 cup or 8 fl oz (237 mL) milk. 1 cup or 5 oz (150 g) strawberries with sugar-free whipped topping. Carbohydrate calculation Identify the foods that contain carbohydrates: Rice. Corn. Milk. Strawberries. Calculate how many servings you have of each food: 2 servings rice. 1 serving corn. 1 serving milk. 1 serving strawberries. Multiply each number of servings by 15 g: 2 servings  rice x 15 g = 30 g. 1 serving corn x 15 g = 15 g. 1 serving milk x 15 g = 15 g. 1 serving strawberries x 15 g = 15 g. Add together all of the amounts to find the total grams of carbohydrates eaten: 30 g + 15 g + 15 g + 15 g = 75 g of carbohydrates total. What are tips for following this plan? Shopping Develop a meal plan and then make a shopping list. Buy fresh and frozen vegetables, fresh and frozen fruit, dairy, eggs, beans, lentils, and whole grains. Look at food labels. Choose foods that have more fiber and less sugar. Avoid processed foods and foods with added sugars. Meal planning Aim to have the same amount of carbohydrates at each meal and for each snack time. Plan to have regular, balanced meals and snacks. Where to find more information American Diabetes Association: www.diabetes.org Centers for Disease Control and Prevention: www.cdc.gov Summary Carbohydrate counting is a method of keeping track of how many carbohydrates you eat. Eating carbohydrates naturally increases the amount of sugar (glucose) in the blood. Counting how many carbohydrates you eat improves your blood glucose control, which helps you manage your diabetes. A dietitian can help you make a meal plan and calculate how many carbohydrates you should have at each meal and snack. This information is not intended to replace advice given to you by your health care provider. Make sure you discuss any questions you have with your healthcare provider. Document Revised: 05/19/2019 Document Reviewed: 05/20/2019 Elsevier Patient Education  2021 [employer] Inc.  

## 2020-12-06 LAB — CMP14+EGFR
ALT: 33 IU/L (ref 0–44)
AST: 31 IU/L (ref 0–40)
Albumin/Globulin Ratio: 2.3 — ABNORMAL HIGH (ref 1.2–2.2)
Albumin: 4.8 g/dL (ref 3.8–4.8)
Alkaline Phosphatase: 100 IU/L (ref 44–121)
BUN/Creatinine Ratio: 8 — ABNORMAL LOW (ref 10–24)
BUN: 10 mg/dL (ref 8–27)
Bilirubin Total: 0.4 mg/dL (ref 0.0–1.2)
CO2: 24 mmol/L (ref 20–29)
Calcium: 9.8 mg/dL (ref 8.6–10.2)
Chloride: 102 mmol/L (ref 96–106)
Creatinine, Ser: 1.3 mg/dL — ABNORMAL HIGH (ref 0.76–1.27)
Globulin, Total: 2.1 g/dL (ref 1.5–4.5)
Glucose: 158 mg/dL — ABNORMAL HIGH (ref 65–99)
Potassium: 4.6 mmol/L (ref 3.5–5.2)
Sodium: 140 mmol/L (ref 134–144)
Total Protein: 6.9 g/dL (ref 6.0–8.5)
eGFR: 62 mL/min/{1.73_m2} (ref >59)

## 2020-12-06 LAB — CBC WITH DIFFERENTIAL/PLATELET
Basophils Absolute: 0.1 10*3/uL (ref 0.0–0.2)
Basos: 1 %
EOS (ABSOLUTE): 0.5 10*3/uL — ABNORMAL HIGH (ref 0.0–0.4)
Eos: 5 %
Hematocrit: 47.4 % (ref 37.5–51.0)
Hemoglobin: 16 g/dL (ref 13.0–17.7)
Immature Grans (Abs): 0 10*3/uL (ref 0.0–0.1)
Immature Granulocytes: 0 %
Lymphocytes Absolute: 2.3 10*3/uL (ref 0.7–3.1)
Lymphs: 26 %
MCH: 30.1 pg (ref 26.6–33.0)
MCHC: 33.8 g/dL (ref 31.5–35.7)
MCV: 89 fL (ref 79–97)
Monocytes Absolute: 0.8 10*3/uL (ref 0.1–0.9)
Monocytes: 9 %
Neutrophils Absolute: 5.1 10*3/uL (ref 1.4–7.0)
Neutrophils: 59 %
Platelets: 272 10*3/uL (ref 150–450)
RBC: 5.31 x10E6/uL (ref 4.14–5.80)
RDW: 13.1 % (ref 11.6–15.4)
WBC: 8.6 10*3/uL (ref 3.4–10.8)

## 2020-12-06 LAB — LIPID PANEL
Chol/HDL Ratio: 2.3 ratio (ref 0.0–5.0)
Cholesterol, Total: 170 mg/dL (ref 100–199)
HDL: 73 mg/dL (ref >39)
LDL Chol Calc (NIH): 83 mg/dL (ref 0–99)
Triglycerides: 74 mg/dL (ref 0–149)
VLDL Cholesterol Cal: 14 mg/dL (ref 5–40)

## 2020-12-06 NOTE — Progress Notes (Signed)
Hello Jackson Booth,  Your lab result is normal and/or stable.Some minor variations that are not significant are commonly marked abnormal, but do not represent any medical problem for you.  Best regards, Kunta Hilleary, M.D.

## 2021-01-20 LAB — HM DIABETES EYE EXAM

## 2021-03-12 ENCOUNTER — Ambulatory Visit: Payer: 59 | Admitting: Family Medicine

## 2021-04-11 ENCOUNTER — Ambulatory Visit: Payer: 59 | Admitting: Family Medicine

## 2021-04-11 ENCOUNTER — Other Ambulatory Visit: Payer: Self-pay

## 2021-04-11 ENCOUNTER — Encounter: Payer: Self-pay | Admitting: Family Medicine

## 2021-04-11 VITALS — BP 134/79 | HR 68 | Temp 97.0°F | Ht 69.0 in | Wt 181.0 lb

## 2021-04-11 DIAGNOSIS — I10 Essential (primary) hypertension: Secondary | ICD-10-CM | POA: Diagnosis not present

## 2021-04-11 DIAGNOSIS — Z23 Encounter for immunization: Secondary | ICD-10-CM | POA: Diagnosis not present

## 2021-04-11 DIAGNOSIS — Z125 Encounter for screening for malignant neoplasm of prostate: Secondary | ICD-10-CM | POA: Diagnosis not present

## 2021-04-11 DIAGNOSIS — Z9114 Patient's other noncompliance with medication regimen: Secondary | ICD-10-CM

## 2021-04-11 DIAGNOSIS — E782 Mixed hyperlipidemia: Secondary | ICD-10-CM

## 2021-04-11 DIAGNOSIS — E785 Hyperlipidemia, unspecified: Secondary | ICD-10-CM

## 2021-04-11 DIAGNOSIS — E1169 Type 2 diabetes mellitus with other specified complication: Secondary | ICD-10-CM | POA: Diagnosis not present

## 2021-04-11 DIAGNOSIS — Z91119 Patient's noncompliance with dietary regimen due to unspecified reason: Secondary | ICD-10-CM | POA: Insufficient documentation

## 2021-04-11 LAB — BAYER DCA HB A1C WAIVED: HB A1C (BAYER DCA - WAIVED): 8.6 % — ABNORMAL HIGH (ref 4.8–5.6)

## 2021-04-11 NOTE — Progress Notes (Signed)
Subjective:  Patient ID: Jackson Booth,  male    DOB: 07/11/1955  Age: 65 y.o.    CC: Medical Management of Chronic Issues   HPI ASHTIN ROSNER presents for  follow-up of hypertension. Patient has no history of headache chest pain or shortness of breath or recent cough. Patient also denies symptoms of TIA such as numbness weakness lateralizing. Patient denies side effects from medication. States taking it regularly.  Patient also  in for follow-up of elevated cholesterol. Doing well without complaints on current medication. Denies side effects  including myalgia and arthralgia and nausea. Also in today for liver function testing. Currently no chest pain, shortness of breath or other cardiovascular related symptoms noted.  Follow-up of diabetes. Patient does check blood sugar at home. Readings run between 140-160 fasting and 200+ prandial.  Patient denies symptoms such as excessive hunger or urinary frequency, excessive hunger, nausea No significant hypoglycemic spells noted. Medications reviewed. Pt reports taking them regularly. Pt. denies complication/adverse reaction today.  Not eating regularly. Snacking with watching TV.  Lacks motivation to exercise. Didn't take the farxiga. Refuses. 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   He has a past surgical history that includes LEFT HEART CATH AND CORONARY ANGIOGRAPHY (N/A, 08/04/2016); CORONARY STENT INTERVENTION (N/A, 08/04/2016); Inguinal hernia repair (Right, 1986); Carpal tunnel release (Right, 1986); Nasal septum surgery (~ 1975; 1977); Coronary angioplasty  with stent (; 08/04/2016; 08/07/2016); and CORONARY STENT INTERVENTION (N/A, 08/07/2016).   His family history includes Heart disease in his brother and mother; Hypertension in his father and mother.He reports that he has never smoked. He has never used smokeless tobacco. He reports that he does not drink alcohol and does not use drugs.  Current Outpatient Medications on File Prior to Visit  Medication Sig Dispense Refill   aspirin 81 MG chewable tablet Chew 1 tablet (81 mg total) by mouth daily.     benazepril (LOTENSIN) 40 MG tablet TAKE 1 TABLET BY MOUTH EVERY DAY IN THE EVENING 90 tablet 3   celecoxib (CELEBREX) 200 MG capsule Take 1 capsule (200 mg total) by mouth daily. With food 30 capsule 5   clopidogrel (PLAVIX) 75 MG tablet Take 1 tablet (75 mg total) by mouth daily. 90 tablet 3   dapagliflozin propanediol (FARXIGA) 10 MG TABS tablet Take 1 tablet (10 mg total) by mouth daily before breakfast. 90 tablet 3   fexofenadine-pseudoephedrine (ALLEGRA-D 24) 180-240 MG 24 hr tablet Take 1 tablet by mouth every evening. For allergy and congestion 30 tablet 11   glipiZIDE (GLUCOTROL) 5 MG tablet Take 1 tablet (5 mg total) by mouth daily before breakfast. 90 tablet 3   metoprolol succinate (TOPROL-XL) 25 MG 24 hr tablet Take 1 tablet (25 mg total) by mouth daily. 90 tablet 3   Misc Natural Products (GLUCOSAMINE CHOND CMP DOUBLE PO) Take by mouth daily.     Multiple Vitamin (MULTIVITAMIN ADULT PO) Take by mouth daily.     nitroGLYCERIN (NITROSTAT) 0.4 MG SL tablet Place 1 tablet (0.4 mg total) under the tongue every 5 (five) minutes as needed for chest pain. 25 tablet 1   rosuvastatin (CRESTOR) 40 MG tablet Take 1 tablet (40 mg  total) by mouth daily. 90 tablet 3   Semaglutide, 1 MG/DOSE, (OZEMPIC, 1 MG/DOSE,) 2 MG/1.5ML SOPN Inject 2 mg into the skin once a week. 9 mL 3   No current facility-administered medications on file prior to visit.    ROS Review of Systems  Constitutional:  Negative for  fever.  Respiratory:  Negative for shortness of breath.   Cardiovascular:  Negative for chest pain.  Musculoskeletal:  Negative for arthralgias.  Skin:  Negative for rash.   Objective:  BP 134/79   Pulse 68   Temp (!) 97 F (36.1 C)   Ht 5' 9"  (1.753 m)   Wt 181 lb (82.1 kg)   SpO2 97%   BMI 26.73 kg/m   BP Readings from Last 3 Encounters:  04/11/21 134/79  12/05/20 115/75  09/05/20 117/78    Wt Readings from Last 3 Encounters:  04/11/21 181 lb (82.1 kg)  12/05/20 167 lb (75.8 kg)  09/05/20 159 lb 3.2 oz (72.2 kg)     Physical Exam Vitals reviewed.  Constitutional:      Appearance: He is well-developed.  HENT:     Head: Normocephalic and atraumatic.     Right Ear: External ear normal.     Left Ear: External ear normal.     Mouth/Throat:     Pharynx: No oropharyngeal exudate or posterior oropharyngeal erythema.  Eyes:     Pupils: Pupils are equal, round, and reactive to light.  Cardiovascular:     Rate and Rhythm: Normal rate and regular rhythm.     Heart sounds: No murmur heard. Pulmonary:     Effort: No respiratory distress.     Breath sounds: Normal breath sounds.  Musculoskeletal:     Cervical back: Normal range of motion and neck supple.  Neurological:     Mental Status: He is alert and oriented to person, place, and time.    Diabetic Foot Exam - Simple   No data filed       Assessment & Plan:   Catalino was seen today for medical management of chronic issues.  Diagnoses and all orders for this visit:  Essential hypertension -     CMP14+EGFR  Mixed hyperlipidemia -     Lipid panel  DM type 2 with diabetic dyslipidemia ([place]) -     Bayer DCA Hb A1c Waived -     CBC with Differential/Platelet -     Microalbumin / creatinine urine ratio  Screening for prostate cancer -     PSA, total and free  Need for shingles vaccine -     Varicella-zoster vaccine IM (Shingrix)  Noncompliance of patient with dietary regimen  Noncompliance with  medication regimen  I am having Giancarlos G. Zayed maintain his aspirin, nitroGLYCERIN, Misc Natural Products (GLUCOSAMINE CHOND CMP DOUBLE PO), Multiple Vitamin (MULTIVITAMIN ADULT PO), dapagliflozin propanediol, celecoxib, fexofenadine-pseudoephedrine, benazepril, clopidogrel, glipiZIDE, metoprolol succinate, rosuvastatin, and Ozempic (1 MG/DOSE).  No orders of the defined types were placed in this encounter.    Follow-up: Return in about 3 months (around 07/12/2021).  Claretta Fraise, M.D.

## 2021-04-12 LAB — CMP14+EGFR
ALT: 32 IU/L (ref 0–44)
AST: 23 IU/L (ref 0–40)
Albumin/Globulin Ratio: 2.3 — ABNORMAL HIGH (ref 1.2–2.2)
Albumin: 4.5 g/dL (ref 3.8–4.8)
Alkaline Phosphatase: 96 IU/L (ref 44–121)
BUN/Creatinine Ratio: 11 (ref 10–24)
BUN: 14 mg/dL (ref 8–27)
Bilirubin Total: 0.4 mg/dL (ref 0.0–1.2)
CO2: 26 mmol/L (ref 20–29)
Calcium: 9.6 mg/dL (ref 8.6–10.2)
Chloride: 103 mmol/L (ref 96–106)
Creatinine, Ser: 1.22 mg/dL (ref 0.76–1.27)
Globulin, Total: 2 g/dL (ref 1.5–4.5)
Glucose: 184 mg/dL — ABNORMAL HIGH (ref 70–99)
Potassium: 4.6 mmol/L (ref 3.5–5.2)
Sodium: 139 mmol/L (ref 134–144)
Total Protein: 6.5 g/dL (ref 6.0–8.5)
eGFR: 67 mL/min/{1.73_m2} (ref >59)

## 2021-04-12 LAB — CBC WITH DIFFERENTIAL/PLATELET
Basophils Absolute: 0.1 10*3/uL (ref 0.0–0.2)
Basos: 1 %
EOS (ABSOLUTE): 0.4 10*3/uL (ref 0.0–0.4)
Eos: 6 %
Hematocrit: 48.9 % (ref 37.5–51.0)
Hemoglobin: 15.8 g/dL (ref 13.0–17.7)
Immature Grans (Abs): 0 10*3/uL (ref 0.0–0.1)
Immature Granulocytes: 0 %
Lymphocytes Absolute: 2.2 10*3/uL (ref 0.7–3.1)
Lymphs: 32 %
MCH: 29.2 pg (ref 26.6–33.0)
MCHC: 32.3 g/dL (ref 31.5–35.7)
MCV: 90 fL (ref 79–97)
Monocytes Absolute: 0.7 10*3/uL (ref 0.1–0.9)
Monocytes: 10 %
Neutrophils Absolute: 3.5 10*3/uL (ref 1.4–7.0)
Neutrophils: 51 %
Platelets: 249 10*3/uL (ref 150–450)
RBC: 5.41 x10E6/uL (ref 4.14–5.80)
RDW: 13.2 % (ref 11.6–15.4)
WBC: 6.9 10*3/uL (ref 3.4–10.8)

## 2021-04-12 LAB — LIPID PANEL
Chol/HDL Ratio: 2.3 ratio (ref 0.0–5.0)
Cholesterol, Total: 151 mg/dL (ref 100–199)
HDL: 67 mg/dL (ref >39)
LDL Chol Calc (NIH): 73 mg/dL (ref 0–99)
Triglycerides: 48 mg/dL (ref 0–149)
VLDL Cholesterol Cal: 11 mg/dL (ref 5–40)

## 2021-04-12 LAB — PSA, TOTAL AND FREE
PSA, Free Pct: 31.3 %
PSA, Free: 0.5 ng/mL
Prostate Specific Ag, Serum: 1.6 ng/mL (ref 0.0–4.0)

## 2021-07-14 ENCOUNTER — Other Ambulatory Visit: Payer: Self-pay | Admitting: Family Medicine

## 2021-07-14 DIAGNOSIS — E785 Hyperlipidemia, unspecified: Secondary | ICD-10-CM

## 2021-07-14 DIAGNOSIS — E1169 Type 2 diabetes mellitus with other specified complication: Secondary | ICD-10-CM

## 2021-07-15 ENCOUNTER — Ambulatory Visit: Payer: 59 | Admitting: Family Medicine

## 2021-07-17 ENCOUNTER — Other Ambulatory Visit: Payer: Self-pay | Admitting: Family Medicine

## 2021-07-17 MED ORDER — SEMAGLUTIDE (2 MG/DOSE) 8 MG/3ML ~~LOC~~ SOPN: 2.0000 mg | PEN_INJECTOR | SUBCUTANEOUS | 3 refills | Status: DC

## 2022-01-27 ENCOUNTER — Other Ambulatory Visit: Payer: Self-pay | Admitting: Family Medicine

## 2022-01-27 DIAGNOSIS — E782 Mixed hyperlipidemia: Secondary | ICD-10-CM

## 2022-01-27 DIAGNOSIS — I10 Essential (primary) hypertension: Secondary | ICD-10-CM

## 2022-01-27 DIAGNOSIS — I2510 Atherosclerotic heart disease of native coronary artery without angina pectoris: Secondary | ICD-10-CM

## 2022-01-27 DIAGNOSIS — E1169 Type 2 diabetes mellitus with other specified complication: Secondary | ICD-10-CM

## 2022-02-06 ENCOUNTER — Other Ambulatory Visit: Payer: Self-pay | Admitting: Family Medicine

## 2022-02-06 DIAGNOSIS — I10 Essential (primary) hypertension: Secondary | ICD-10-CM

## 2022-02-06 DIAGNOSIS — E1169 Type 2 diabetes mellitus with other specified complication: Secondary | ICD-10-CM

## 2022-03-01 ENCOUNTER — Other Ambulatory Visit: Payer: Self-pay | Admitting: Family Medicine

## 2022-03-01 DIAGNOSIS — I2510 Atherosclerotic heart disease of native coronary artery without angina pectoris: Secondary | ICD-10-CM

## 2022-03-01 DIAGNOSIS — E782 Mixed hyperlipidemia: Secondary | ICD-10-CM

## 2022-03-01 DIAGNOSIS — E1169 Type 2 diabetes mellitus with other specified complication: Secondary | ICD-10-CM

## 2022-03-17 ENCOUNTER — Encounter: Payer: Self-pay | Admitting: Family Medicine

## 2022-03-17 ENCOUNTER — Ambulatory Visit: Payer: 59 | Admitting: Family Medicine

## 2022-03-17 VITALS — BP 142/79 | HR 60 | Temp 97.5°F | Ht 69.0 in | Wt 177.6 lb

## 2022-03-17 DIAGNOSIS — E782 Mixed hyperlipidemia: Secondary | ICD-10-CM

## 2022-03-17 DIAGNOSIS — I10 Essential (primary) hypertension: Secondary | ICD-10-CM | POA: Diagnosis not present

## 2022-03-17 DIAGNOSIS — Z91199 Patient's noncompliance with other medical treatment and regimen due to unspecified reason: Secondary | ICD-10-CM

## 2022-03-17 DIAGNOSIS — E785 Hyperlipidemia, unspecified: Secondary | ICD-10-CM

## 2022-03-17 DIAGNOSIS — I2510 Atherosclerotic heart disease of native coronary artery without angina pectoris: Secondary | ICD-10-CM | POA: Diagnosis not present

## 2022-03-17 DIAGNOSIS — E1169 Type 2 diabetes mellitus with other specified complication: Secondary | ICD-10-CM | POA: Diagnosis not present

## 2022-03-17 LAB — BAYER DCA HB A1C WAIVED: HB A1C (BAYER DCA - WAIVED): 9.3 % — ABNORMAL HIGH (ref 4.8–5.6)

## 2022-03-17 MED ORDER — BENAZEPRIL HCL 40 MG PO TABS: ORAL_TABLET | ORAL | 2 refills | Status: DC

## 2022-03-17 MED ORDER — ROSUVASTATIN CALCIUM 40 MG PO TABS: 40.0000 mg | ORAL_TABLET | Freq: Every day | ORAL | 3 refills | Status: DC

## 2022-03-17 MED ORDER — SEMAGLUTIDE (2 MG/DOSE) 8 MG/3ML ~~LOC~~ SOPN: 2.0000 mg | PEN_INJECTOR | SUBCUTANEOUS | 3 refills | Status: DC

## 2022-03-17 MED ORDER — CLOPIDOGREL BISULFATE 75 MG PO TABS: 75.0000 mg | ORAL_TABLET | Freq: Every day | ORAL | 3 refills | Status: DC

## 2022-03-17 MED ORDER — METOPROLOL SUCCINATE ER 25 MG PO TB24: 25.0000 mg | ORAL_TABLET | Freq: Every day | ORAL | 3 refills | Status: DC

## 2022-03-17 MED ORDER — GLIPIZIDE 5 MG PO TABS: 5.0000 mg | ORAL_TABLET | Freq: Every day | ORAL | 3 refills | Status: DC

## 2022-03-17 NOTE — Progress Notes (Signed)
Subjective:  Patient ID: Jackson Booth,  male    DOB: [birth year]  Age: 66 y.o.    CC: Medical Management of Chronic Issues   HPI Jackson Booth presents for  follow-up of hypertension. Patient has no history of headache chest pain or shortness of breath or recent cough. Patient also denies symptoms of TIA such as numbness weakness lateralizing. Patient denies side effects from medication. States taking it regularly.  Patient also  in for follow-up of elevated cholesterol. Doing well without complaints on current medication. Denies side effects  including myalgia and arthralgia and nausea. Also in today for liver function testing. Currently no chest pain, shortness of breath or other cardiovascular related symptoms noted.  Follow-up of diabetes. Patient does check blood sugar at home. Readings run between 160 and 250 fasting. Eats whatever he wants. Unwilling to change. Doesn't exercise. Unwilling to start. Refuses further medication Patient denies symptoms such as excessive hunger or urinary frequency, excessive hunger, nausea No significant hypoglycemic spells noted. Medications reviewed. Pt reports taking them regularly. Pt. denies complication/adverse reaction today.    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   He has a past surgical history that includes LEFT HEART CATH AND CORONARY ANGIOGRAPHY (N/A, 08/04/2016); CORONARY STENT INTERVENTION (N/A, 08/04/2016); Inguinal hernia repair (Right, 1986); Carpal tunnel release (Right, 1986); Nasal septum surgery (~ 1975; 1977); Coronary angioplasty with stent (; 08/04/2016; 08/07/2016); and CORONARY STENT INTERVENTION (N/A, 08/07/2016).   His  family history includes Heart disease in his brother and mother; Hypertension in his father and mother.He reports that he has never smoked. He has never used smokeless tobacco. He reports that he does not drink alcohol and does not use drugs.  Current Outpatient Medications on File Prior to Visit  Medication Sig Dispense Refill   Misc Natural Products (GLUCOSAMINE CHOND CMP DOUBLE PO) Take by mouth daily.     Multiple Vitamin (MULTIVITAMIN ADULT PO) Take by mouth daily.     nitroGLYCERIN (NITROSTAT) 0.4 MG SL tablet Place 1 tablet (0.4 mg total) under the tongue every 5 (five) minutes as needed for chest pain. 25 tablet 1   No current facility-administered medications on file prior to visit.    ROS Review of Systems  Constitutional:  Negative for fever.  Respiratory:  Negative for shortness of breath.   Cardiovascular:  Negative for chest pain.  Musculoskeletal:  Arthralgias: takes an Excedrin every morning and has no problems the rest of the day..  Skin:  Negative for rash.    Objective:  BP (!) 142/79   Pulse 60   Temp (!) 97.5 F (36.4 C)   Ht _0  (1.753 m)   Wt 177 lb 9.6 oz (80.6 kg)   SpO2 95%   BMI 26.23 kg/m   BP Readings from Last 3 Encounters:  03/17/22 (!) 142/79  04/11/21 134/79  12/05/20 115/75    Wt Readings from Last 3 Encounters:  03/17/22 177 lb 9.6 oz (80.6 kg)  04/11/21 181 lb (82.1 kg)  12/05/20 167 lb (75.8 kg)     Physical Exam Vitals reviewed.  Constitutional:      Appearance: He is well-developed.  HENT:     Head: Normocephalic and atraumatic.     Right Ear: External ear normal.  Left Ear: External ear normal.     Mouth/Throat:     Pharynx: No oropharyngeal exudate or posterior oropharyngeal erythema.  Eyes:     Pupils: Pupils are equal, round, and reactive to light.  Cardiovascular:     Rate and Rhythm: Normal rate and regular rhythm.     Heart sounds: No murmur heard. Pulmonary:     Effort: No respiratory distress.     Breath  sounds: Normal breath sounds.  Musculoskeletal:     Cervical back: Normal range of motion and neck supple.  Neurological:     Mental Status: He is alert and oriented to person, place, and time.     Diabetic Foot Exam - Simple   No data filed     Lab Results  Component Value Date   HGBA1C 8.6 (H) 04/11/2021   HGBA1C 7.6 (H) 12/05/2020   HGBA1C 7.7 (H) 08/20/2020    Assessment & Plan:   Jackson Booth was seen today for medical management of chronic issues.  Diagnoses and all orders for this visit:  Essential hypertension -     CBC with Differential/Platelet -     CMP14+EGFR -     benazepril (LOTENSIN) 40 MG tablet; TAKE 1 TABLET BY MOUTH EVERY DAY IN THE EVENING. -     metoprolol succinate (TOPROL-XL) 25 MG 24 hr tablet; Take 1 tablet (25 mg total) by mouth daily.  Mixed hyperlipidemia -     Lipid panel -     rosuvastatin (CRESTOR) 40 MG tablet; Take 1 tablet (40 mg total) by mouth daily.  DM type 2 with diabetic dyslipidemia ([facility]) -     Bayer DCA Hb A1c Waived -     Microalbumin / creatinine urine ratio -     benazepril (LOTENSIN) 40 MG tablet; TAKE 1 TABLET BY MOUTH EVERY DAY IN THE EVENING. -     glipiZIDE (GLUCOTROL) 5 MG tablet; Take 1 tablet (5 mg total) by mouth daily before breakfast.  Coronary artery disease involving native coronary artery of native heart without angina pectoris -     clopidogrel (PLAVIX) 75 MG tablet; Take 1 tablet (75 mg total) by mouth daily. -     metoprolol succinate (TOPROL-XL) 25 MG 24 hr tablet; Take 1 tablet (25 mg total) by mouth daily. -     rosuvastatin (CRESTOR) 40 MG tablet; Take 1 tablet (40 mg total) by mouth daily.  Medically noncompliant  Other orders -     Semaglutide, 2 MG/DOSE, 8 MG/3ML SOPN; Inject 2 mg as directed once a week.   I have discontinued Jackson Booth. Jackson Booth's aspirin, dapagliflozin propanediol, celecoxib, and fexofenadine-pseudoephedrine. I have also changed his benazepril, clopidogrel, glipiZIDE, metoprolol succinate,  and rosuvastatin. Additionally, I am having him maintain his nitroGLYCERIN, Misc Natural Products (GLUCOSAMINE CHOND CMP DOUBLE PO), Multiple Vitamin (MULTIVITAMIN ADULT PO), and Semaglutide (2 MG/DOSE).  Meds ordered this encounter  Medications   benazepril (LOTENSIN) 40 MG tablet    Sig: TAKE 1 TABLET BY MOUTH EVERY DAY IN THE EVENING.    Dispense:  90 tablet    Refill:  2   clopidogrel (PLAVIX) 75 MG tablet    Sig: Take 1 tablet (75 mg total) by mouth daily.    Dispense:  90 tablet    Refill:  3   glipiZIDE (GLUCOTROL) 5 MG tablet    Sig: Take 1 tablet (5 mg total) by mouth daily before breakfast.    Dispense:  90 tablet    Refill:  3   metoprolol  succinate (TOPROL-XL) 25 MG 24 hr tablet    Sig: Take 1 tablet (25 mg total) by mouth daily.    Dispense:  90 tablet    Refill:  3   rosuvastatin (CRESTOR) 40 MG tablet    Sig: Take 1 tablet (40 mg total) by mouth daily.    Dispense:  90 tablet    Refill:  3   Semaglutide, 2 MG/DOSE, 8 MG/3ML SOPN    Sig: Inject 2 mg as directed once a week.    Dispense:  12 mL    Refill:  3     Follow-up: Return in about 6 months (around 09/16/2022).  Claretta Fraise, M.D.

## 2022-03-18 LAB — CBC WITH DIFFERENTIAL/PLATELET
Basophils Absolute: 0.1 10*3/uL (ref 0.0–0.2)
Basos: 1 %
EOS (ABSOLUTE): 0.4 10*3/uL (ref 0.0–0.4)
Eos: 6 %
Hematocrit: 47 % (ref 37.5–51.0)
Hemoglobin: 15.9 g/dL (ref 13.0–17.7)
Immature Grans (Abs): 0 10*3/uL (ref 0.0–0.1)
Immature Granulocytes: 0 %
Lymphocytes Absolute: 2 10*3/uL (ref 0.7–3.1)
Lymphs: 27 %
MCH: 30.2 pg (ref 26.6–33.0)
MCHC: 33.8 g/dL (ref 31.5–35.7)
MCV: 89 fL (ref 79–97)
Monocytes Absolute: 0.7 10*3/uL (ref 0.1–0.9)
Monocytes: 9 %
Neutrophils Absolute: 4.2 10*3/uL (ref 1.4–7.0)
Neutrophils: 57 %
Platelets: 244 10*3/uL (ref 150–450)
RBC: 5.26 x10E6/uL (ref 4.14–5.80)
RDW: 12.4 % (ref 11.6–15.4)
WBC: 7.4 10*3/uL (ref 3.4–10.8)

## 2022-03-18 LAB — CMP14+EGFR
ALT: 26 IU/L (ref 0–44)
AST: 15 IU/L (ref 0–40)
Albumin/Globulin Ratio: 2 (ref 1.2–2.2)
Albumin: 4.3 g/dL (ref 3.9–4.9)
Alkaline Phosphatase: 102 IU/L (ref 44–121)
BUN/Creatinine Ratio: 7 — ABNORMAL LOW (ref 10–24)
BUN: 9 mg/dL (ref 8–27)
Bilirubin Total: 0.4 mg/dL (ref 0.0–1.2)
CO2: 25 mmol/L (ref 20–29)
Calcium: 9.6 mg/dL (ref 8.6–10.2)
Chloride: 103 mmol/L (ref 96–106)
Creatinine, Ser: 1.29 mg/dL — ABNORMAL HIGH (ref 0.76–1.27)
Globulin, Total: 2.2 g/dL (ref 1.5–4.5)
Glucose: 130 mg/dL — ABNORMAL HIGH (ref 70–99)
Potassium: 4.3 mmol/L (ref 3.5–5.2)
Sodium: 141 mmol/L (ref 134–144)
Total Protein: 6.5 g/dL (ref 6.0–8.5)
eGFR: 62 mL/min/{1.73_m2} (ref >59)

## 2022-03-18 LAB — LIPID PANEL
Chol/HDL Ratio: 2.3 ratio (ref 0.0–5.0)
Cholesterol, Total: 149 mg/dL (ref 100–199)
HDL: 65 mg/dL (ref >39)
LDL Chol Calc (NIH): 73 mg/dL (ref 0–99)
Triglycerides: 53 mg/dL (ref 0–149)
VLDL Cholesterol Cal: 11 mg/dL (ref 5–40)

## 2022-03-18 LAB — MICROALBUMIN / CREATININE URINE RATIO
Creatinine, Urine: 34.2 mg/dL
Microalb/Creat Ratio: <9 mg/g creat (ref 0–29)
Microalbumin, Urine: <3 ug/mL

## 2022-03-18 NOTE — Progress Notes (Signed)
Hello Jackson Booth,  Your lab result is normal and/or stable.Some minor variations that are not significant are commonly marked abnormal, but do not represent any medical problem for you.  Best regards, Elleah Hemsley, M.D.

## 2022-08-04 ENCOUNTER — Other Ambulatory Visit: Payer: Self-pay | Admitting: Family Medicine

## 2022-09-10 IMAGING — DX DG LUMBAR SPINE 2-3V
2 series · 2 of 2 positions shown · non-contrast
Comparison: None.

CLINICAL DATA: Pain

EXAM:
LUMBAR SPINE - 2-3 VIEW

[l-spine ap]
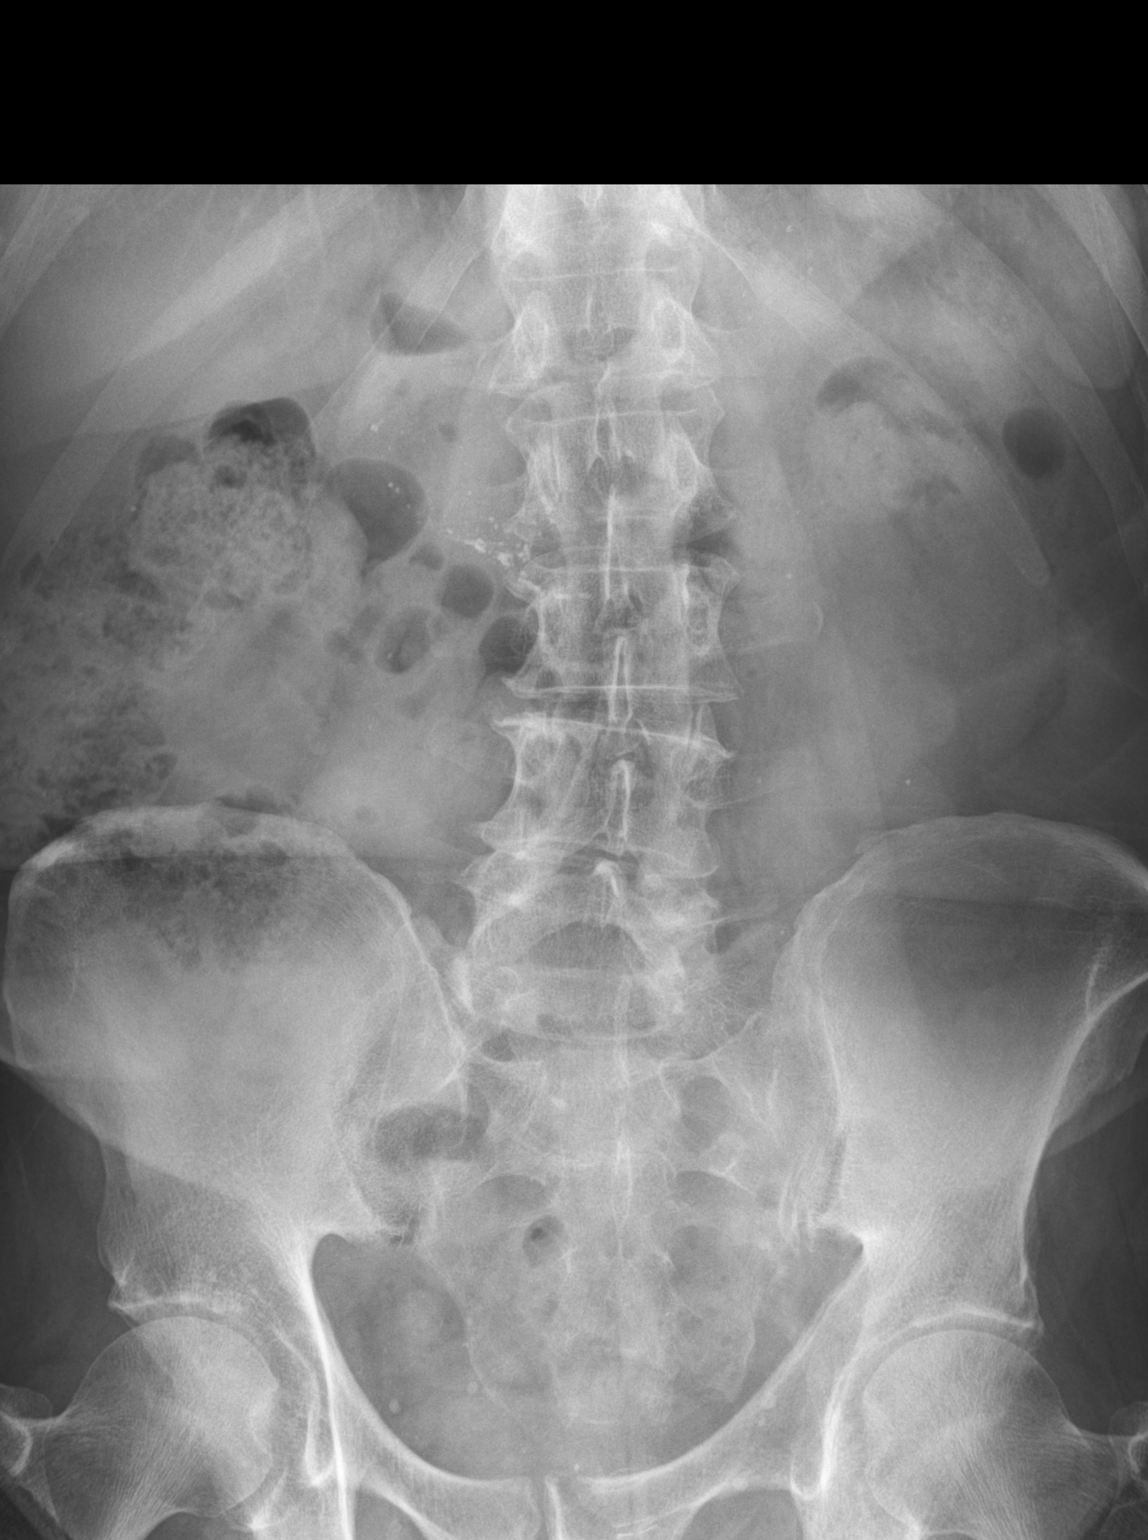

[l-spine lat]
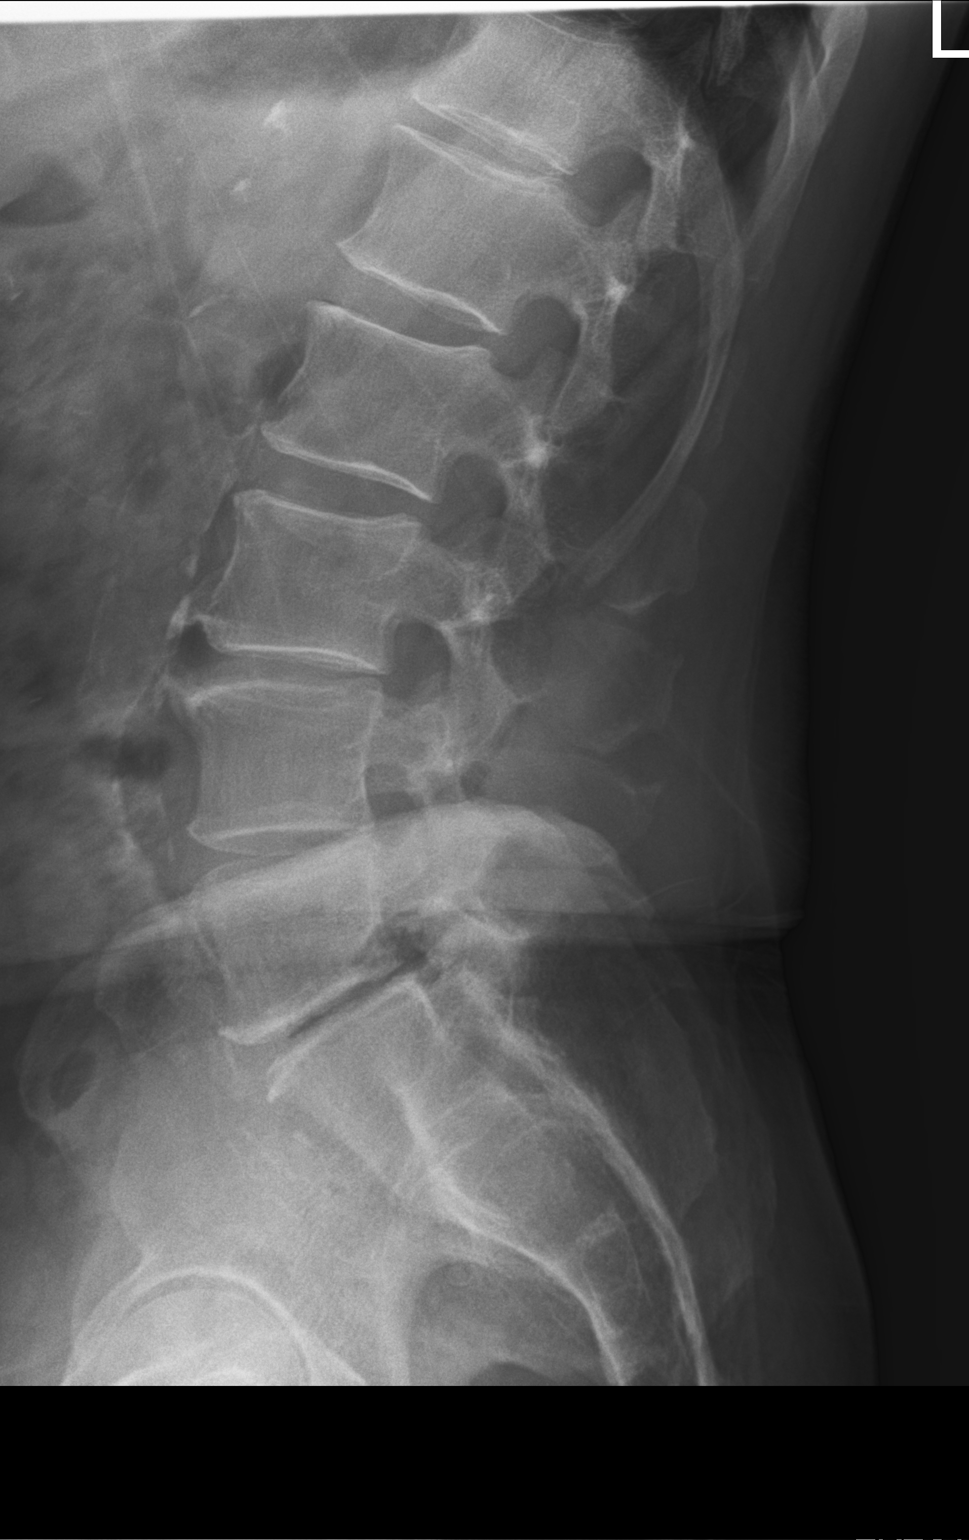

[2 of 2 positions shown; findings below may reference images not displayed]

FINDINGS: There is moderate severe disc height loss at L5-S1 level and the
L3-L4 level. There is no acute compression fracture. No significant
malalignment. There are areas of facet arthrosis in the lower lumbar
segments. Vascular calcifications are noted. There are punctate
calcifications projecting over the upper midline abdomen which may
represent enteric material within the bowel versus pancreatic
calcifications.
IMPRESSION: 1. No acute osseous abnormality.
2. Moderate to severe degenerative disc disease at L3-L4 and L5-S1.
3. Vascular calcifications.
4. Possible pancreatic calcifications which can be seen in patients
with chronic pancreatitis.

## 2022-09-16 ENCOUNTER — Encounter: Payer: Self-pay | Admitting: Family Medicine

## 2022-09-16 ENCOUNTER — Ambulatory Visit: Payer: 59 | Admitting: Family Medicine

## 2022-09-16 VITALS — BP 132/69 | HR 71 | Temp 97.5°F | Ht 69.0 in | Wt 174.4 lb

## 2022-09-16 DIAGNOSIS — Z125 Encounter for screening for malignant neoplasm of prostate: Secondary | ICD-10-CM | POA: Diagnosis not present

## 2022-09-16 DIAGNOSIS — E1165 Type 2 diabetes mellitus with hyperglycemia: Secondary | ICD-10-CM | POA: Diagnosis not present

## 2022-09-16 DIAGNOSIS — E782 Mixed hyperlipidemia: Secondary | ICD-10-CM

## 2022-09-16 DIAGNOSIS — I10 Essential (primary) hypertension: Secondary | ICD-10-CM | POA: Diagnosis not present

## 2022-09-16 LAB — CMP14+EGFR
ALT: 36 IU/L (ref 0–44)
AST: 22 IU/L (ref 0–40)
Albumin/Globulin Ratio: 1.7 (ref 1.2–2.2)
BUN: 14 mg/dL (ref 8–27)
Calcium: 9.6 mg/dL (ref 8.6–10.2)
Chloride: 100 mmol/L (ref 96–106)
Globulin, Total: 2.5 g/dL (ref 1.5–4.5)
Glucose: 305 mg/dL — ABNORMAL HIGH (ref 70–99)
Total Protein: 6.8 g/dL (ref 6.0–8.5)

## 2022-09-16 LAB — CBC WITH DIFFERENTIAL/PLATELET
EOS (ABSOLUTE): 0.3 10*3/uL (ref 0.0–0.4)
Eos: 4 %
Hemoglobin: 15.9 g/dL (ref 13.0–17.7)
Lymphocytes Absolute: 2 10*3/uL (ref 0.7–3.1)
Lymphs: 24 %
RDW: 13.4 % (ref 11.6–15.4)

## 2022-09-16 LAB — BAYER DCA HB A1C WAIVED: HB A1C (BAYER DCA - WAIVED): 10.3 % — ABNORMAL HIGH (ref 4.8–5.6)

## 2022-09-16 LAB — LIPID PANEL

## 2022-09-16 LAB — PSA

## 2022-09-16 NOTE — Progress Notes (Signed)
Subjective:  Patient ID: Jackson Booth, male    DOB: [birth year]  Age: 66 y.o. MRN: [id number]  CC: Medical Management of Chronic Issues   HPI Jackson Booth presents forFollow-up of diabetes. Patient checks blood sugar at home.   200-230 fasting mostly and not checking postprandial. Occasonally over 300 or down around 170.Heavy snacking at night, Nabs for breakfast and loves his sweet tea. Will work on cutting back.  Patient denies symptoms such as polyuria, polydipsia, excessive hunger, nausea No significant hypoglycemic spells noted. Medications reviewed. Pt reports taking them regularly without complication/adverse reaction being reported today.   MI in 2018. Due for cardiology recheck. Sees Jackson Booth, [facility]   History Jackson Booth has a past medical history of Anginal pain, Anxiety, Arthritis, CAD S/P percutaneous coronary angioplasty (08/07/2016), Family history of adverse reaction to anesthesia, High cholesterol, History of vertigo, Hypertension, Inferolateral myocardial infarction (08/04/2016), Sinus headache, and Type II diabetes mellitus.   He has a past surgical history that includes LEFT HEART CATH AND CORONARY ANGIOGRAPHY (N/A, 08/04/2016); CORONARY STENT INTERVENTION (N/A, 08/04/2016); Inguinal hernia repair (Right, 1986); Carpal tunnel release (Right, 1986); Nasal septum surgery (~ 1975; 1977); Coronary angioplasty with stent (; 08/04/2016; 08/07/2016); and CORONARY STENT INTERVENTION (N/A, 08/07/2016).   His family history includes Heart disease in his brother and mother; Hypertension in his father and mother.He reports that he has never smoked. He has never used smokeless tobacco. He reports that he does not drink alcohol and does not use drugs.  Current Outpatient Medications on File Prior to Visit  Medication Sig Dispense Refill   benazepril (LOTENSIN) 40 MG tablet TAKE 1 TABLET BY MOUTH EVERY DAY IN THE EVENING. 90 tablet 2   clopidogrel (PLAVIX) 75 MG tablet Take 1 tablet (75 mg  total) by mouth daily. 90 tablet 3   glipiZIDE (GLUCOTROL) 5 MG tablet Take 1 tablet (5 mg total) by mouth daily before breakfast. 90 tablet 3   metoprolol succinate (TOPROL-XL) 25 MG 24 hr tablet Take 1 tablet (25 mg total) by mouth daily. 90 tablet 3   Misc Natural Products (GLUCOSAMINE CHOND CMP DOUBLE PO) Take by mouth daily.     Multiple Vitamin (MULTIVITAMIN ADULT PO) Take by mouth daily.     rosuvastatin (CRESTOR) 40 MG tablet Take 1 tablet (40 mg total) by mouth daily. 90 tablet 3   Semaglutide, 2 MG/DOSE, (OZEMPIC, 2 MG/DOSE,) 8 MG/3ML SOPN INJECT 2 MG AS DIRECTED ONCE A WEEK. 9 mL 0   No current facility-administered medications on file prior to visit.    ROS Review of Systems  Constitutional:  Negative for fever.  Respiratory:  Negative for shortness of breath.   Cardiovascular:  Negative for chest pain.  Musculoskeletal:  Negative for arthralgias.  Skin:  Negative for rash.    Objective:  BP 132/69   Pulse 71   Temp (!) 97.5 F (36.4 C)   Ht  (1.753 m)   Wt 174 lb 6.4 oz (79.1 kg)   SpO2 94%   BMI 25.75 kg/m   BP Readings from Last 3 Encounters:  09/16/22 132/69  03/17/22 (!) 142/79  04/11/21 134/79    Wt Readings from Last 3 Encounters:  09/16/22 174 lb 6.4 oz (79.1 kg)  03/17/22 177 lb 9.6 oz (80.6 kg)  04/11/21 181 lb (82.1 kg)     Physical Exam Vitals reviewed.  Constitutional:      Appearance: He is well-developed.  HENT:     Head: Normocephalic and atraumatic.  Right Ear: External ear normal.     Left Ear: External ear normal.     Mouth/Throat:     Pharynx: No oropharyngeal exudate or posterior oropharyngeal erythema.  Eyes:     Pupils: Pupils are equal, round, and reactive to light.  Cardiovascular:     Rate and Rhythm: Normal rate and regular rhythm.     Heart sounds: No murmur heard. Pulmonary:     Effort: No respiratory distress.     Breath sounds: Normal breath sounds.  Musculoskeletal:     Cervical back: Normal range of  motion and neck supple.  Neurological:     Mental Status: He is alert and oriented to person, place, and time.       Assessment & Plan:   Jackson Booth was seen today for medical management of chronic issues.  Diagnoses and all orders for this visit:  Uncontrolled type 2 diabetes mellitus with hyperglycemia -     Bayer DCA Hb A1c Waived  Essential hypertension -     CBC with Differential/Platelet -     CMP14+EGFR  Mixed hyperlipidemia -     Lipid panel  Screening for prostate cancer -     PSA      I have discontinued Jackson Booth's nitroGLYCERIN. I am also having him maintain his Misc Natural Products (GLUCOSAMINE CHOND CMP DOUBLE PO), Multiple Vitamin (MULTIVITAMIN ADULT PO), benazepril, clopidogrel, glipiZIDE, metoprolol succinate, rosuvastatin, and Ozempic (2 MG/DOSE).  Pt. Declined further meds for diabetes. Will cut back on snacking.   Follow-up: Return in about 6 months (around 03/18/2023).  Jackson Booth, M.D.

## 2022-09-17 LAB — LIPID PANEL
Chol/HDL Ratio: 2.6 ratio (ref 0.0–5.0)
HDL: 58 mg/dL (ref >39)
LDL Chol Calc (NIH): 78 mg/dL (ref 0–99)

## 2022-09-17 LAB — CBC WITH DIFFERENTIAL/PLATELET
Basophils Absolute: 0.1 10*3/uL (ref 0.0–0.2)
Basos: 1 %
Hematocrit: 48.6 % (ref 37.5–51.0)
Immature Grans (Abs): 0 10*3/uL (ref 0.0–0.1)
Immature Granulocytes: 0 %
MCH: 29.4 pg (ref 26.6–33.0)
MCHC: 32.7 g/dL (ref 31.5–35.7)
MCV: 90 fL (ref 79–97)
Monocytes Absolute: 0.9 10*3/uL (ref 0.1–0.9)
Monocytes: 10 %
Neutrophils Absolute: 5.1 10*3/uL (ref 1.4–7.0)
Neutrophils: 61 %
Platelets: 263 10*3/uL (ref 150–450)
RBC: 5.4 x10E6/uL (ref 4.14–5.80)
WBC: 8.4 10*3/uL (ref 3.4–10.8)

## 2022-09-17 LAB — CMP14+EGFR
Albumin: 4.3 g/dL (ref 3.9–4.9)
Alkaline Phosphatase: 123 IU/L — ABNORMAL HIGH (ref 44–121)
BUN/Creatinine Ratio: 11 (ref 10–24)
Bilirubin Total: 0.6 mg/dL (ref 0.0–1.2)
CO2: 24 mmol/L (ref 20–29)
Creatinine, Ser: 1.32 mg/dL — ABNORMAL HIGH (ref 0.76–1.27)
Potassium: 4.5 mmol/L (ref 3.5–5.2)
Sodium: 139 mmol/L (ref 134–144)
eGFR: 60 mL/min/{1.73_m2} (ref >59)

## 2022-11-22 ENCOUNTER — Other Ambulatory Visit: Payer: Self-pay | Admitting: Family Medicine

## 2023-03-03 ENCOUNTER — Other Ambulatory Visit: Payer: Self-pay | Admitting: Family Medicine

## 2023-03-03 DIAGNOSIS — I10 Essential (primary) hypertension: Secondary | ICD-10-CM

## 2023-03-03 DIAGNOSIS — E1169 Type 2 diabetes mellitus with other specified complication: Secondary | ICD-10-CM

## 2023-03-18 ENCOUNTER — Encounter: Payer: Self-pay | Admitting: Family Medicine

## 2023-03-18 ENCOUNTER — Ambulatory Visit: Payer: Medicare Other | Admitting: Family Medicine

## 2023-03-18 VITALS — BP 157/87 | HR 72 | Temp 97.4°F | Ht 69.0 in | Wt 174.0 lb

## 2023-03-18 DIAGNOSIS — I10 Essential (primary) hypertension: Secondary | ICD-10-CM | POA: Diagnosis not present

## 2023-03-18 DIAGNOSIS — E1169 Type 2 diabetes mellitus with other specified complication: Secondary | ICD-10-CM | POA: Diagnosis not present

## 2023-03-18 DIAGNOSIS — E1165 Type 2 diabetes mellitus with hyperglycemia: Secondary | ICD-10-CM

## 2023-03-18 DIAGNOSIS — I2510 Atherosclerotic heart disease of native coronary artery without angina pectoris: Secondary | ICD-10-CM

## 2023-03-18 DIAGNOSIS — E785 Hyperlipidemia, unspecified: Secondary | ICD-10-CM

## 2023-03-18 DIAGNOSIS — E782 Mixed hyperlipidemia: Secondary | ICD-10-CM | POA: Diagnosis not present

## 2023-03-18 LAB — CMP14+EGFR
ALT: 28 [IU]/L (ref 0–44)
AST: 22 [IU]/L (ref 0–40)
Albumin: 4.5 g/dL (ref 3.9–4.9)
Alkaline Phosphatase: 96 [IU]/L (ref 44–121)
BUN/Creatinine Ratio: 8 — ABNORMAL LOW (ref 10–24)
BUN: 9 mg/dL (ref 8–27)
Bilirubin Total: 0.6 mg/dL (ref 0.0–1.2)
CO2: 24 mmol/L (ref 20–29)
Calcium: 9.8 mg/dL (ref 8.6–10.2)
Chloride: 100 mmol/L (ref 96–106)
Creatinine, Ser: 1.06 mg/dL (ref 0.76–1.27)
Globulin, Total: 2.3 g/dL (ref 1.5–4.5)
Glucose: 258 mg/dL — ABNORMAL HIGH (ref 70–99)
Potassium: 4.4 mmol/L (ref 3.5–5.2)
Sodium: 139 mmol/L (ref 134–144)
Total Protein: 6.8 g/dL (ref 6.0–8.5)
eGFR: 78 mL/min/{1.73_m2} (ref >59)

## 2023-03-18 LAB — BAYER DCA HB A1C WAIVED: HB A1C (BAYER DCA - WAIVED): 9.9 % — ABNORMAL HIGH (ref 4.8–5.6)

## 2023-03-18 LAB — CBC WITH DIFFERENTIAL/PLATELET
Basophils Absolute: 0.1 10*3/uL (ref 0.0–0.2)
Basos: 1 %
EOS (ABSOLUTE): 0.3 10*3/uL (ref 0.0–0.4)
Eos: 4 %
Hematocrit: 48.6 % (ref 37.5–51.0)
Hemoglobin: 15.8 g/dL (ref 13.0–17.7)
Immature Grans (Abs): 0 10*3/uL (ref 0.0–0.1)
Immature Granulocytes: 0 %
Lymphocytes Absolute: 1.5 10*3/uL (ref 0.7–3.1)
Lymphs: 20 %
MCH: 29.5 pg (ref 26.6–33.0)
MCHC: 32.5 g/dL (ref 31.5–35.7)
MCV: 91 fL (ref 79–97)
Monocytes Absolute: 0.6 10*3/uL (ref 0.1–0.9)
Monocytes: 8 %
Neutrophils Absolute: 4.9 10*3/uL (ref 1.4–7.0)
Neutrophils: 67 %
Platelets: 224 10*3/uL (ref 150–450)
RBC: 5.35 x10E6/uL (ref 4.14–5.80)
RDW: 12.8 % (ref 11.6–15.4)
WBC: 7.3 10*3/uL (ref 3.4–10.8)

## 2023-03-18 LAB — LIPID PANEL
Chol/HDL Ratio: 2.9 {ratio} (ref 0.0–5.0)
Cholesterol, Total: 199 mg/dL (ref 100–199)
HDL: 69 mg/dL (ref >39)
LDL Chol Calc (NIH): 116 mg/dL — ABNORMAL HIGH (ref 0–99)
Triglycerides: 79 mg/dL (ref 0–149)
VLDL Cholesterol Cal: 14 mg/dL (ref 5–40)

## 2023-03-18 NOTE — Progress Notes (Signed)
Subjective:  Patient ID: Jackson Booth, male    DOB: 1956-03-07  Age: 66 y.o. MRN: [id number]  CC: Medical Management of Chronic Issues   HPI Jackson Booth presents forFollow-up of diabetes. Patient checks blood sugar at home.   <Ostly 200-250 with several a bit higher or lower.Patient denies symptoms such as polyuria, polydipsia, excessive hunger, nausea No significant hypoglycemic spells noted. Medications reviewed. Pt reports taking them regularly without complication/adverse reaction being reported today.    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   He has a past surgical history that includes LEFT HEART CATH AND CORONARY ANGIOGRAPHY (N/A, 08/04/2016); CORONARY STENT INTERVENTION (N/A, 08/04/2016); Inguinal hernia repair (Right, 1986); Carpal tunnel release (Right, 1986); Nasal septum surgery (~ 1975; 1977); Coronary angioplasty with stent (; 08/04/2016; 08/07/2016); and CORONARY STENT INTERVENTION (N/A, 08/07/2016).   His family history includes Heart disease in his brother and mother; Hypertension in his father and mother.He reports that he has never smoked. He has never used smokeless tobacco. He reports that he does not drink alcohol and does not use drugs.  Current Outpatient Medications on File Prior to Visit  Medication Sig Dispense Refill   Misc Natural Products (GLUCOSAMINE CHOND CMP DOUBLE PO) Take by mouth daily.     Multiple Vitamin (MULTIVITAMIN ADULT PO) Take by mouth daily.     Semaglutide, 2 MG/DOSE, (OZEMPIC, 2 MG/DOSE,) 8 MG/3ML SOPN INJECT 2 MG AS DIRECTED ONCE A WEEK. 9 mL 1   No current facility-administered medications on file prior to visit.    ROS Review of  Systems  Constitutional:  Negative for fever.  Respiratory:  Negative for shortness of breath.   Cardiovascular:  Negative for chest pain.  Musculoskeletal:  Negative for arthralgias.  Skin:  Negative for rash.    Objective:  BP (!) 157/87   Pulse 72   Temp (!) 97.4 F (36.3 C)   Ht 5\' 9"  (1.753 m)   Wt 174 lb (78.9 kg)   SpO2 96%   BMI 25.70 kg/m   BP Readings from Last 3 Encounters:  03/18/23 (!) 157/87  09/16/22 132/69  03/17/22 (!) 142/79    Wt Readings from Last 3 Encounters:  03/18/23 174 lb (78.9 kg)  09/16/22 174 lb 6.4 oz (79.1 kg)  03/17/22 177 lb 9.6 oz (80.6 kg)     Physical Exam Vitals reviewed.  Constitutional:      Appearance: He is well-developed.  HENT:     Head: Normocephalic and atraumatic.     Right Ear: External ear normal.     Left Ear: External ear normal.     Mouth/Throat:     Pharynx: No oropharyngeal exudate or posterior oropharyngeal erythema.  Eyes:     Pupils: Pupils are equal, round, and reactive to light.  Cardiovascular:     Rate and Rhythm: Normal rate and regular rhythm.     Heart sounds: No murmur heard. Pulmonary:     Effort: No respiratory distress.     Breath sounds: Normal breath sounds.  Musculoskeletal:     Cervical back: Normal range of motion and neck supple.  Neurological:     Mental Status: He is alert and oriented to person, place, and time.       Assessment & Plan:  Jackson Booth was seen today for medical management of chronic issues.  Diagnoses and all orders for this visit:  Essential hypertension -     CBC with Differential/Platelet -     CMP14+EGFR -     benazepril (LOTENSIN) 40 MG tablet; TAKE 1 TABLET BY MOUTH EVERY DAY IN THE EVENING. -     metoprolol succinate (TOPROL-XL) 25 MG 24 hr tablet; Take 1 tablet (25 mg total) by mouth daily.  Mixed hyperlipidemia -     rosuvastatin (CRESTOR) 40 MG tablet; Take 1 tablet (40 mg total) by mouth daily.  Uncontrolled type 2 diabetes mellitus with  hyperglycemia (HCC) -     Bayer DCA Hb A1c Waived -     Lipid panel  DM type 2 with diabetic dyslipidemia (HCC) -     benazepril (LOTENSIN) 40 MG tablet; TAKE 1 TABLET BY MOUTH EVERY DAY IN THE EVENING. -     glipiZIDE (GLUCOTROL) 5 MG tablet; Take 1 tablet (5 mg total) by mouth daily before breakfast.  Coronary artery disease involving native coronary artery of native heart without angina pectoris -     clopidogrel (PLAVIX) 75 MG tablet; Take 1 tablet (75 mg total) by mouth daily. -     metoprolol succinate (TOPROL-XL) 25 MG 24 hr tablet; Take 1 tablet (25 mg total) by mouth daily. -     rosuvastatin (CRESTOR) 40 MG tablet; Take 1 tablet (40 mg total) by mouth daily.      I am having Jackson Booth maintain his Misc Natural Products (GLUCOSAMINE CHOND CMP DOUBLE PO), Multiple Vitamin (MULTIVITAMIN ADULT PO), Ozempic (2 MG/DOSE), benazepril, clopidogrel, glipiZIDE, metoprolol succinate, and rosuvastatin.  Meds ordered this encounter  Medications   benazepril (LOTENSIN) 40 MG tablet    Sig: TAKE 1 TABLET BY MOUTH EVERY DAY IN THE EVENING.    Dispense:  90 tablet    Refill:  2   clopidogrel (PLAVIX) 75 MG tablet    Sig: Take 1 tablet (75 mg total) by mouth daily.    Dispense:  90 tablet    Refill:  3   glipiZIDE (GLUCOTROL) 5 MG tablet    Sig: Take 1 tablet (5 mg total) by mouth daily before breakfast.    Dispense:  90 tablet    Refill:  3   metoprolol succinate (TOPROL-XL) 25 MG 24 hr tablet    Sig: Take 1 tablet (25 mg total) by mouth daily.    Dispense:  90 tablet    Refill:  3   rosuvastatin (CRESTOR) 40 MG tablet    Sig: Take 1 tablet (40 mg total) by mouth daily.    Dispense:  90 tablet    Refill:  3     Follow-up: Return in about 3 months (around 06/18/2023).  Mechele Claude, M.D.

## 2023-03-19 NOTE — Progress Notes (Signed)
Hello Jo,  Your lab result is normal and/or stable.Some minor variations that are not significant are commonly marked abnormal, but do not represent any medical problem for you.(Other than the lack of control of DM as discussed iin the office.)  Best regards, Mechele Claude, M.D.

## 2023-03-22 ENCOUNTER — Encounter: Payer: Self-pay | Admitting: Family Medicine

## 2023-03-22 MED ORDER — BENAZEPRIL HCL 40 MG PO TABS: ORAL_TABLET | ORAL | 2 refills | Status: DC

## 2023-03-22 MED ORDER — METOPROLOL SUCCINATE ER 25 MG PO TB24
25.0000 mg | ORAL_TABLET | Freq: Every day | ORAL | 3 refills | Status: DC
Start: 2023-03-22 — End: 2023-11-04

## 2023-03-22 MED ORDER — ROSUVASTATIN CALCIUM 40 MG PO TABS
40.0000 mg | ORAL_TABLET | Freq: Every day | ORAL | 3 refills | Status: DC
Start: 2023-03-22 — End: 2023-11-04

## 2023-03-22 MED ORDER — GLIPIZIDE 5 MG PO TABS
5.0000 mg | ORAL_TABLET | Freq: Every day | ORAL | 3 refills | Status: DC
Start: 2023-03-22 — End: 2023-11-04

## 2023-03-22 MED ORDER — CLOPIDOGREL BISULFATE 75 MG PO TABS
75.0000 mg | ORAL_TABLET | Freq: Every day | ORAL | 3 refills | Status: DC
Start: 2023-03-22 — End: 2023-11-04

## 2023-05-26 ENCOUNTER — Other Ambulatory Visit: Payer: Self-pay | Admitting: Family Medicine

## 2023-08-04 ENCOUNTER — Encounter: Payer: Self-pay | Admitting: Family Medicine

## 2023-08-04 ENCOUNTER — Ambulatory Visit (INDEPENDENT_AMBULATORY_CARE_PROVIDER_SITE_OTHER)

## 2023-08-04 ENCOUNTER — Ambulatory Visit: Payer: Medicare Other | Admitting: Family Medicine

## 2023-08-04 VITALS — BP 155/86 | HR 68 | Temp 98.0°F | Ht 69.0 in | Wt 168.0 lb

## 2023-08-04 DIAGNOSIS — E782 Mixed hyperlipidemia: Secondary | ICD-10-CM

## 2023-08-04 DIAGNOSIS — M25511 Pain in right shoulder: Secondary | ICD-10-CM | POA: Diagnosis not present

## 2023-08-04 DIAGNOSIS — Z0001 Encounter for general adult medical examination with abnormal findings: Secondary | ICD-10-CM

## 2023-08-04 DIAGNOSIS — R351 Nocturia: Secondary | ICD-10-CM

## 2023-08-04 DIAGNOSIS — I10 Essential (primary) hypertension: Secondary | ICD-10-CM

## 2023-08-04 DIAGNOSIS — E1165 Type 2 diabetes mellitus with hyperglycemia: Secondary | ICD-10-CM | POA: Diagnosis not present

## 2023-08-04 DIAGNOSIS — N401 Enlarged prostate with lower urinary tract symptoms: Secondary | ICD-10-CM

## 2023-08-04 DIAGNOSIS — E1169 Type 2 diabetes mellitus with other specified complication: Secondary | ICD-10-CM

## 2023-08-04 DIAGNOSIS — Z7984 Long term (current) use of oral hypoglycemic drugs: Secondary | ICD-10-CM

## 2023-08-04 DIAGNOSIS — I2510 Atherosclerotic heart disease of native coronary artery without angina pectoris: Secondary | ICD-10-CM

## 2023-08-04 DIAGNOSIS — E785 Hyperlipidemia, unspecified: Secondary | ICD-10-CM

## 2023-08-04 DIAGNOSIS — Z1211 Encounter for screening for malignant neoplasm of colon: Secondary | ICD-10-CM

## 2023-08-04 DIAGNOSIS — Z125 Encounter for screening for malignant neoplasm of prostate: Secondary | ICD-10-CM

## 2023-08-04 LAB — LIPID PANEL

## 2023-08-04 LAB — BAYER DCA HB A1C WAIVED: HB A1C (BAYER DCA - WAIVED): 11.4 % — ABNORMAL HIGH (ref 4.8–5.6)

## 2023-08-04 NOTE — Progress Notes (Unsigned)
 Annual Wellness Visit     Patient: Jackson Booth, Male    DOB: [birth year], 68 y.o.   MRN: [id number]  Subjective  Chief Complaint  Patient presents with   Medicare Wellness   Shoulder Pain    Right shoulder pain since falling on ice a month back. Popping and clicking.     JAYSHAUN PHILLIPS is a 68 y.o. male who presents today for his Annual Wellness Visit. He reports consuming a general diet. The patient does not participate in regular exercise at present. He generally feels well. He reports sleeping poorly. Awakened by right shoulder pain. Marland Kitchen He does have additional problems to discuss today. Larey Seat on [place] in January. Staying active. Snaps with flexion.   Sugar RUNNING REAL HIGH. Log reviewed. Most fasting running 200-250. Can't seem to stop snacking. Eats Nabs, trail Mix. Honey Nut Cheerios. Apples. Turke Sticks Right shoulder pain has been interfering with sleep when he rolls over. ROM is liited. Pain is oderate to severe and progressing     Vision:Within last year and Dental: No current dental problems   Allergies  Allergen Reactions   Carrot [Daucus Carota] Swelling    PT IS ALLERGIC TO RAW CARROTS    Pecan Nut (Diagnostic) Itching and Swelling    Tongue swells   Tape Rash      Medications: Outpatient Medications Prior to Visit  Medication Sig   benazepril (LOTENSIN) 40 MG tablet TAKE 1 TABLET BY MOUTH EVERY DAY IN THE EVENING.   clopidogrel (PLAVIX) 75 MG tablet Take 1 tablet (75 mg total) by mouth daily.   glipiZIDE (GLUCOTROL) 5 MG tablet Take 1 tablet (5 mg total) by mouth daily before breakfast.   metoprolol succinate (TOPROL-XL) 25 MG 24 hr tablet Take 1 tablet (25 mg total) by mouth daily.   Misc Natural Products (GLUCOSAMINE CHOND CMP DOUBLE PO) Take by mouth daily.   Multiple Vitamin (MULTIVITAMIN ADULT PO) Take by mouth daily.   rosuvastatin (CRESTOR) 40 MG tablet Take 1 tablet (40 mg total) by mouth daily.   Semaglutide, 2 MG/DOSE, (OZEMPIC, 2 MG/DOSE,) 8  MG/3ML SOPN INJECT 2 MG AS DIRECTED ONCE A WEEK.   No facility-administered medications prior to visit.    Allergies  Allergen Reactions   Carrot [Daucus Carota] Swelling    PT IS ALLERGIC TO RAW CARROTS    Pecan Nut (Diagnostic) Itching and Swelling    Tongue swells   Tape Rash    Patient Care Team: Mechele Claude, MD as PCP - General (Family Medicine)  Review of Systems  Constitutional:  Negative for chills, diaphoresis, fever, malaise/fatigue and weight loss.  HENT:  Positive for congestion and hearing loss. Negative for sore throat.   Eyes:  Negative for blurred vision, double vision, photophobia, pain, discharge and redness.  Respiratory:  Negative for cough and shortness of breath.   Cardiovascular:  Negative for chest pain, palpitations, orthopnea, leg swelling and PND.  Gastrointestinal:  Negative for abdominal pain, blood in stool, constipation, diarrhea, heartburn, melena, nausea and vomiting.  Genitourinary:  Positive for frequency. Negative for urgency.  Musculoskeletal:  Positive for joint pain (right shoulder). Negative for back pain, falls, myalgias and neck pain.  Skin:  Negative for itching and rash.  Neurological:  Negative for dizziness, tingling, tremors, sensory change, speech change, focal weakness, seizures, loss of consciousness, weakness and headaches.  Endo/Heme/Allergies:  Negative for environmental allergies and polydipsia. Does not bruise/bleed easily.  Psychiatric/Behavioral:  Negative for memory loss and suicidal ideas. The patient  does not have insomnia.         Objective  BP (!) 155/86   Pulse 68   Temp 98 F (36.7 C)   Ht 5\' 9"  (1.753 m)   Wt 168 lb (76.2 kg)   SpO2 99%   BMI 24.81 kg/m  BP Readings from Last 3 Encounters:  08/04/23 (!) 155/86  03/18/23 (!) 157/87  09/16/22 132/69   Wt Readings from Last 3 Encounters:  08/04/23 168 lb (76.2 kg)  03/18/23 174 lb (78.9 kg)  09/16/22 174 lb 6.4 oz (79.1 kg)   SpO2 Readings from Last  3 Encounters:  08/04/23 99%  03/18/23 96%  09/16/22 94%    Physical Exam Vitals reviewed.  Constitutional:      General: He is not in acute distress.    Appearance: He is well-developed.  HENT:     Head: Normocephalic and atraumatic.     Right Ear: External ear normal.     Left Ear: External ear normal.     Nose: Nose normal.  Eyes:     Conjunctiva/sclera: Conjunctivae normal.     Pupils: Pupils are equal, round, and reactive to light.  Cardiovascular:     Rate and Rhythm: Normal rate and regular rhythm.     Heart sounds: Normal heart sounds. No murmur heard. Pulmonary:     Effort: Pulmonary effort is normal. No respiratory distress.     Breath sounds: Normal breath sounds. No wheezing or rales.  Abdominal:     Palpations: Abdomen is soft.     Tenderness: There is no abdominal tenderness.  Musculoskeletal:        General: Tenderness (tender at anterior right shoulder. Decreased ROM for flexion, abduction activiely. The RUE is neurovascularly intact.) present. Normal range of motion.     Cervical back: Normal range of motion and neck supple.  Skin:    General: Skin is warm and dry.  Neurological:     Mental Status: He is alert and oriented to person, place, and time.     Deep Tendon Reflexes: Reflexes are normal and symmetric.  Psychiatric:        Behavior: Behavior normal.        Thought Content: Thought content normal.        Judgment: Judgment normal.       Most recent functional status assessment:     No data to display         Most recent fall risk assessment:    08/04/2023   10:23 AM  Fall Risk   Falls in the past year? 1  Number falls in past yr: 0  Injury with Fall? 1    Most recent depression screenings:    08/04/2023   10:26 AM 03/18/2023    8:51 AM  PHQ 2/9 Scores  PHQ - 2 Score 1 1  PHQ- 9 Score 8 2   Most recent cognitive screening:     No data to display         Most recent Audit-C alcohol use screening    08/04/2023   10:18 AM   Alcohol Use Disorder Test (AUDIT)  1. How often do you have a drink containing alcohol? 0  2. How many drinks containing alcohol do you have on a typical day when you are drinking? 0  3. How often do you have six or more drinks on one occasion? 0  AUDIT-C Score 0   A score of 3 or more in women, and 4 or more  in men indicates increased risk for alcohol abuse, EXCEPT if all of the points are from question 1   Vision/Hearing Screen: No results found.    Results for orders placed or performed in visit on 08/04/23  Bayer DCA Hb A1c Waived  Result Value Ref Range   HB A1C (BAYER DCA - WAIVED) 11.4 (H) 4.8 - 5.6 %  CBC with Differential/Platelet  Result Value Ref Range   WBC 7.3 3.4 - 10.8 x10E3/uL   RBC 5.61 4.14 - 5.80 x10E6/uL   Hemoglobin 16.1 13.0 - 17.7 g/dL   Hematocrit 57.8 46.9 - 51.0 %   MCV 90 79 - 97 fL   MCH 28.7 26.6 - 33.0 pg   MCHC 31.8 31.5 - 35.7 g/dL   RDW 62.9 52.8 - 41.3 %   Platelets 231 150 - 450 x10E3/uL   Neutrophils 57 Not Estab. %   Lymphs 27 Not Estab. %   Monocytes 9 Not Estab. %   Eos 6 Not Estab. %   Basos 1 Not Estab. %   Neutrophils Absolute 4.2 1.4 - 7.0 x10E3/uL   Lymphocytes Absolute 2.0 0.7 - 3.1 x10E3/uL   Monocytes Absolute 0.6 0.1 - 0.9 x10E3/uL   EOS (ABSOLUTE) 0.4 0.0 - 0.4 x10E3/uL   Basophils Absolute 0.1 0.0 - 0.2 x10E3/uL   Immature Granulocytes 0 Not Estab. %   Immature Grans (Abs) 0.0 0.0 - 0.1 x10E3/uL  CMP14+EGFR  Result Value Ref Range   Glucose 236 (H) 70 - 99 mg/dL   BUN 12 8 - 27 mg/dL   Creatinine, Ser 2.44 0.76 - 1.27 mg/dL   eGFR 82 >01 UU/VOZ/3.66   BUN/Creatinine Ratio 12 10 - 24   Sodium 140 134 - 144 mmol/L   Potassium 4.4 3.5 - 5.2 mmol/L   Chloride 104 96 - 106 mmol/L   CO2 22 20 - 29 mmol/L   Calcium 9.2 8.6 - 10.2 mg/dL   Total Protein 6.3 6.0 - 8.5 g/dL   Albumin 4.3 3.9 - 4.9 g/dL   Globulin, Total 2.0 1.5 - 4.5 g/dL   Bilirubin Total 0.6 0.0 - 1.2 mg/dL   Alkaline Phosphatase 110 44 - 121 IU/L    AST 20 0 - 40 IU/L   ALT 31 0 - 44 IU/L  Lipid panel  Result Value Ref Range   Cholesterol, Total 147 100 - 199 mg/dL   Triglycerides 52 0 - 149 mg/dL   HDL 64 >44 mg/dL   VLDL Cholesterol Cal 11 5 - 40 mg/dL   LDL Chol Calc (NIH) 72 0 - 99 mg/dL   Chol/HDL Ratio 2.3 0.0 - 5.0 ratio      Assessment & Plan   Annual wellness visit done today including the all of the following: Reviewed patient's Family Medical History Reviewed and updated list of patient's medical providers Assessment of cognitive impairment was done Assessed patient's functional ability Established a written schedule for health screening services Health Risk Assessent Completed and Reviewed  Exercise Activities and Dietary recommendations  Goals   None     Immunization History  Administered Date(s) Administered   PFIZER(Purple Top)SARS-COV-2 Vaccination 09/01/2019, 09/23/2019   Tdap 08/20/2020   Zoster Recombinant(Shingrix) 12/05/2020, 04/11/2021    Health Maintenance  Topic Date Due   FOOT EXAM  12/05/2021   OPHTHALMOLOGY EXAM  01/20/2022   Diabetic kidney evaluation - Urine ACR  03/18/2023   COVID-19 Vaccine (3 - 2024-25 season) 08/20/2023 (Originally 02/01/2023)   INFLUENZA VACCINE  08/31/2023 (Originally 01/01/2023)   Pneumonia  Vaccine 4+ Years old (1 of 2 - PCV) 03/17/2024 (Originally 12/23/1963)   Colonoscopy  08/03/2024 (Originally 12/23/2002)   HEMOGLOBIN A1C  02/04/2024   Diabetic kidney evaluation - eGFR measurement  08/03/2024   Medicare Annual Wellness (AWV)  08/03/2024   DTaP/Tdap/Td (2 - Td or Tdap) 08/21/2030   Hepatitis C Screening  Completed   HIV Screening  Completed   Zoster Vaccines- Shingrix  Completed   HPV VACCINES  Aged Out     Discussed health benefits of physical activity, and encouraged him to engage in regular exercise appropriate for his age and condition.    Problem List Items Addressed This Visit       Active Problems   Coronary artery disease   Relevant Orders    CBC with Differential/Platelet (Completed)   CMP14+EGFR (Completed)   Lipid panel (Completed)   DM type 2 with diabetic dyslipidemia (HCC)   Relevant Orders   Bayer DCA Hb A1c Waived (Completed)   Essential hypertension - Primary   Relevant Orders   CBC with Differential/Platelet (Completed)   HLD (hyperlipidemia)   Relevant Orders   Lipid panel (Completed)   Other Visit Diagnoses       Uncontrolled type 2 diabetes mellitus with hyperglycemia (HCC)       Relevant Orders   Bayer DCA Hb A1c Waived (Completed)     Prostate cancer screening         Benign prostatic hyperplasia with nocturia       Relevant Orders   PSA, total and free     Screen for colon cancer       Relevant Orders   Cologuard     Acute pain of right shoulder       Relevant Orders   DG Shoulder Right       Return in about 3 months (around 11/04/2023) for diabetes.     Mechele Claude, MD

## 2023-08-05 LAB — CMP14+EGFR
ALT: 31 IU/L (ref 0–44)
AST: 20 IU/L (ref 0–40)
Albumin: 4.3 g/dL (ref 3.9–4.9)
Alkaline Phosphatase: 110 IU/L (ref 44–121)
BUN/Creatinine Ratio: 12 (ref 10–24)
BUN: 12 mg/dL (ref 8–27)
Bilirubin Total: 0.6 mg/dL (ref 0.0–1.2)
CO2: 22 mmol/L (ref 20–29)
Calcium: 9.2 mg/dL (ref 8.6–10.2)
Chloride: 104 mmol/L (ref 96–106)
Creatinine, Ser: 1.02 mg/dL (ref 0.76–1.27)
Globulin, Total: 2 g/dL (ref 1.5–4.5)
Glucose: 236 mg/dL — ABNORMAL HIGH (ref 70–99)
Potassium: 4.4 mmol/L (ref 3.5–5.2)
Sodium: 140 mmol/L (ref 134–144)
Total Protein: 6.3 g/dL (ref 6.0–8.5)
eGFR: 82 mL/min/{1.73_m2} (ref >59)

## 2023-08-05 LAB — LIPID PANEL
Cholesterol, Total: 147 mg/dL (ref 100–199)
HDL: 64 mg/dL (ref >39)
LDL CALC COMMENT:: 2.3 ratio (ref 0.0–5.0)
LDL Chol Calc (NIH): 72 mg/dL (ref 0–99)
Triglycerides: 52 mg/dL (ref 0–149)
VLDL Cholesterol Cal: 11 mg/dL (ref 5–40)

## 2023-08-05 LAB — CBC WITH DIFFERENTIAL/PLATELET
Basophils Absolute: 0.1 10*3/uL (ref 0.0–0.2)
Basos: 1 %
EOS (ABSOLUTE): 0.4 10*3/uL (ref 0.0–0.4)
Eos: 6 %
Hematocrit: 50.7 % (ref 37.5–51.0)
Hemoglobin: 16.1 g/dL (ref 13.0–17.7)
Immature Grans (Abs): 0 10*3/uL (ref 0.0–0.1)
Immature Granulocytes: 0 %
Lymphocytes Absolute: 2 10*3/uL (ref 0.7–3.1)
Lymphs: 27 %
MCH: 28.7 pg (ref 26.6–33.0)
MCHC: 31.8 g/dL (ref 31.5–35.7)
MCV: 90 fL (ref 79–97)
Monocytes Absolute: 0.6 10*3/uL (ref 0.1–0.9)
Monocytes: 9 %
Neutrophils Absolute: 4.2 10*3/uL (ref 1.4–7.0)
Neutrophils: 57 %
Platelets: 231 10*3/uL (ref 150–450)
RBC: 5.61 x10E6/uL (ref 4.14–5.80)
RDW: 12.7 % (ref 11.6–15.4)
WBC: 7.3 10*3/uL (ref 3.4–10.8)

## 2023-08-22 ENCOUNTER — Other Ambulatory Visit: Payer: Self-pay | Admitting: Family Medicine

## 2023-09-08 LAB — COLOGUARD: COLOGUARD: NEGATIVE

## 2023-09-16 ENCOUNTER — Ambulatory Visit: Payer: Medicare Other | Admitting: Family Medicine

## 2023-11-05 ENCOUNTER — Ambulatory Visit (INDEPENDENT_AMBULATORY_CARE_PROVIDER_SITE_OTHER): Admitting: Family Medicine

## 2023-11-05 ENCOUNTER — Encounter: Payer: Self-pay | Admitting: Family Medicine

## 2023-11-05 ENCOUNTER — Ambulatory Visit: Payer: Self-pay | Admitting: Family Medicine

## 2023-11-05 VITALS — BP 144/76 | HR 72 | Temp 98.4°F | Ht 69.0 in | Wt 170.0 lb

## 2023-11-05 DIAGNOSIS — E785 Hyperlipidemia, unspecified: Secondary | ICD-10-CM

## 2023-11-05 DIAGNOSIS — I10 Essential (primary) hypertension: Secondary | ICD-10-CM | POA: Diagnosis not present

## 2023-11-05 DIAGNOSIS — E782 Mixed hyperlipidemia: Secondary | ICD-10-CM

## 2023-11-05 DIAGNOSIS — E1169 Type 2 diabetes mellitus with other specified complication: Secondary | ICD-10-CM | POA: Diagnosis not present

## 2023-11-05 DIAGNOSIS — Z7984 Long term (current) use of oral hypoglycemic drugs: Secondary | ICD-10-CM

## 2023-11-05 DIAGNOSIS — I2510 Atherosclerotic heart disease of native coronary artery without angina pectoris: Secondary | ICD-10-CM | POA: Diagnosis not present

## 2023-11-05 DIAGNOSIS — Z125 Encounter for screening for malignant neoplasm of prostate: Secondary | ICD-10-CM

## 2023-11-05 LAB — BAYER DCA HB A1C WAIVED: HB A1C (BAYER DCA - WAIVED): 10.3 % — ABNORMAL HIGH (ref 4.8–5.6)

## 2023-11-05 MED ORDER — CLOPIDOGREL BISULFATE 75 MG PO TABS: 75.0000 mg | ORAL_TABLET | Freq: Every day | ORAL | 3 refills | Status: AC

## 2023-11-05 MED ORDER — GLIPIZIDE 5 MG PO TABS: 5.0000 mg | ORAL_TABLET | Freq: Every day | ORAL | 3 refills | Status: AC

## 2023-11-05 MED ORDER — METOPROLOL SUCCINATE ER 25 MG PO TB24: 25.0000 mg | ORAL_TABLET | Freq: Every day | ORAL | 3 refills | Status: AC

## 2023-11-05 MED ORDER — ROSUVASTATIN CALCIUM 40 MG PO TABS: 40.0000 mg | ORAL_TABLET | Freq: Every day | ORAL | 3 refills | Status: AC

## 2023-11-05 MED ORDER — BENAZEPRIL HCL 40 MG PO TABS: ORAL_TABLET | ORAL | 2 refills | Status: AC

## 2023-11-05 MED ORDER — OZEMPIC (2 MG/DOSE) 8 MG/3ML ~~LOC~~ SOPN: 2.0000 mg | PEN_INJECTOR | SUBCUTANEOUS | 3 refills | Status: AC

## 2023-11-05 NOTE — Progress Notes (Unsigned)
 Subjective:  Patient ID: Jackson Booth, male    DOB: [birth year]  Age: 68 y.o. MRN: [id number]  CC: Medical Management of Chronic Issues (No concerns at this time. )   HPI Jackson Booth presents forFollow-up of diabetes. Patient checks blood sugar at home.   175-250 fasting.  Log attached.  Patient denies symptoms such as polyuria, polydipsia, excessive hunger, nausea No significant hypoglycemic spells noted. Medications reviewed. Pt reports taking them regularly without complication/adverse reaction being reported today.   Follow-up of hypertension. Patient has no history of headache chest pain or shortness of breath or recent cough. Patient also denies symptoms of TIA such as numbness weakness lateralizing. Patient checks  blood pressure at home and has not had any elevated readings recently. Patient denies side effects from his medication. States taking it regularly.  Jackson Booth states he really does not want any further treatments added to what he is currently doing in spite of the elevations of both blood pressure and A1c.  We discussed endorgan damage that could occur.  He was willing to accept that risk to avoid taking further medication.  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   He has a past surgical history that includes LEFT HEART CATH AND CORONARY ANGIOGRAPHY (N/A, 08/04/2016); CORONARY STENT INTERVENTION (N/A, 08/04/2016); Inguinal hernia repair (Right, 1986); Carpal tunnel release (Right, 1986); Nasal septum surgery (~ 1975; 1977); Coronary angioplasty with stent (; 08/04/2016; 08/07/2016); and CORONARY STENT INTERVENTION (N/A, 08/07/2016).   His family history includes Heart disease  in his brother and mother; Hypertension in his father and mother.He reports that he has never smoked. He has never used smokeless tobacco. He reports that he does not drink alcohol and does not use drugs.  Current Outpatient Medications on File Prior to Visit  Medication Sig Dispense Refill   Misc Natural Products (GLUCOSAMINE CHOND CMP DOUBLE PO) Take by mouth daily.     Multiple Vitamin (MULTIVITAMIN ADULT PO) Take by mouth daily.     No current facility-administered medications on file prior to visit.    ROS Review of Systems  Constitutional:  Negative for fever.  Respiratory:  Negative for shortness of breath.   Cardiovascular:  Negative for chest pain.  Musculoskeletal:  Negative for arthralgias.  Skin:  Negative for rash.    Objective:  BP (!) 144/76   Pulse 72   Temp 98.4 F (36.9 C)   Ht 5\' 9"  (1.753 m)   Wt 170 lb (77.1 kg)   SpO2 96%   BMI 25.10 kg/m   BP Readings from Last 3 Encounters:  11/05/23 (!) 144/76  08/04/23 (!) 155/86  03/18/23 (!) 157/87    Wt Readings from Last 3 Encounters:  11/05/23 170 lb (77.1 kg)  08/04/23 168 lb (76.2 kg)  03/18/23 174 lb (78.9 kg)    Lab Results  Component Value Date   HGBA1C 10.3 (H) 11/05/2023   HGBA1C 11.4 (H) 08/04/2023   HGBA1C 9.9 (H) 03/18/2023    Physical Exam Vitals reviewed.  Constitutional:      Appearance: He is well-developed.  HENT:     Head: Normocephalic and atraumatic.     Right Ear: External ear normal.     Left Ear: External ear normal.     Mouth/Throat:  Pharynx: No oropharyngeal exudate or posterior oropharyngeal erythema.  Eyes:     Pupils: Pupils are equal, round, and reactive to light.  Cardiovascular:     Rate and Rhythm: Normal rate and regular rhythm.     Heart sounds: No murmur heard. Pulmonary:     Effort: No respiratory distress.     Breath sounds: Normal breath sounds.  Musculoskeletal:     Cervical back: Normal range of motion and neck supple.  Neurological:      Mental Status: He is alert and oriented to person, place, and time.        Assessment & Plan:  DM type 2 with diabetic dyslipidemia (HCC) -     Bayer DCA Hb A1c Waived -     Benazepril  HCl; TAKE 1 TABLET BY MOUTH EVERY DAY IN THE EVENING.  Dispense: 90 tablet; Refill: 2 -     glipiZIDE ; Take 1 tablet (5 mg total) by mouth daily before breakfast.  Dispense: 90 tablet; Refill: 3  Mixed hyperlipidemia -     CMP14+EGFR -     Rosuvastatin  Calcium ; Take 1 tablet (40 mg total) by mouth daily.  Dispense: 90 tablet; Refill: 3  Essential hypertension -     CMP14+EGFR -     Benazepril  HCl; TAKE 1 TABLET BY MOUTH EVERY DAY IN THE EVENING.  Dispense: 90 tablet; Refill: 2 -     Metoprolol  Succinate ER; Take 1 tablet (25 mg total) by mouth daily.  Dispense: 90 tablet; Refill: 3  Coronary artery disease involving native coronary artery of native heart without angina pectoris -     CMP14+EGFR -     Metoprolol  Succinate ER; Take 1 tablet (25 mg total) by mouth daily.  Dispense: 90 tablet; Refill: 3 -     Rosuvastatin  Calcium ; Take 1 tablet (40 mg total) by mouth daily.  Dispense: 90 tablet; Refill: 3 -     Clopidogrel  Bisulfate; Take 1 tablet (75 mg total) by mouth daily.  Dispense: 90 tablet; Refill: 3  Prostate cancer screening  Screening for prostate cancer  Other orders -     Ozempic  (2 MG/DOSE); Inject 2 mg into the skin once a week.  Dispense: 9 mL; Refill: 3   Jackson Booth is not interested in bringing his glucose down nor his blood pressure.  He declines any further medication or other treatments.  He is willing to check his sugar daily.   Follow-up: Return in about 6 months (around 05/06/2024) for diabetes, Compete physical.  Roise Cleaver, M.D.

## 2023-11-06 ENCOUNTER — Encounter: Payer: Self-pay | Admitting: Family Medicine

## 2023-11-06 LAB — CMP14+EGFR
ALT: 30 IU/L (ref 0–44)
AST: 19 IU/L (ref 0–40)
Albumin: 4.2 g/dL (ref 3.9–4.9)
Alkaline Phosphatase: 99 IU/L (ref 44–121)
BUN/Creatinine Ratio: 11 (ref 10–24)
BUN: 13 mg/dL (ref 8–27)
Bilirubin Total: 0.3 mg/dL (ref 0.0–1.2)
CO2: 24 mmol/L (ref 20–29)
Calcium: 9.5 mg/dL (ref 8.6–10.2)
Chloride: 102 mmol/L (ref 96–106)
Creatinine, Ser: 1.17 mg/dL (ref 0.76–1.27)
Globulin, Total: 1.9 g/dL (ref 1.5–4.5)
Glucose: 281 mg/dL — ABNORMAL HIGH (ref 70–99)
Potassium: 5.1 mmol/L (ref 3.5–5.2)
Sodium: 138 mmol/L (ref 134–144)
Total Protein: 6.1 g/dL (ref 6.0–8.5)
eGFR: 69 mL/min/{1.73_m2} (ref >59)

## 2024-05-09 ENCOUNTER — Ambulatory Visit: Payer: Self-pay | Admitting: Family Medicine

## 2024-05-09 ENCOUNTER — Encounter: Payer: Self-pay | Admitting: Family Medicine

## 2024-05-09 VITALS — BP 147/81 | HR 76 | Temp 97.5°F | Ht 69.0 in | Wt 168.0 lb

## 2024-05-09 DIAGNOSIS — E1169 Type 2 diabetes mellitus with other specified complication: Secondary | ICD-10-CM

## 2024-05-09 DIAGNOSIS — I2510 Atherosclerotic heart disease of native coronary artery without angina pectoris: Secondary | ICD-10-CM

## 2024-05-09 DIAGNOSIS — Z125 Encounter for screening for malignant neoplasm of prostate: Secondary | ICD-10-CM

## 2024-05-09 LAB — COMPREHENSIVE METABOLIC PANEL WITH GFR
ALT: 46 IU/L — ABNORMAL HIGH (ref 0–44)
AST: 23 IU/L (ref 0–40)
Albumin: 4.4 g/dL (ref 3.9–4.9)
Alkaline Phosphatase: 99 IU/L (ref 47–123)
BUN/Creatinine Ratio: 10 (ref 10–24)
BUN: 12 mg/dL (ref 8–27)
Bilirubin Total: 0.5 mg/dL (ref 0.0–1.2)
CO2: 25 mmol/L (ref 20–29)
Calcium: 9.6 mg/dL (ref 8.6–10.2)
Chloride: 103 mmol/L (ref 96–106)
Creatinine, Ser: 1.16 mg/dL (ref 0.76–1.27)
Globulin, Total: 2 g/dL (ref 1.5–4.5)
Glucose: 308 mg/dL — ABNORMAL HIGH (ref 70–99)
Potassium: 5.3 mmol/L — ABNORMAL HIGH (ref 3.5–5.2)
Sodium: 140 mmol/L (ref 134–144)
Total Protein: 6.4 g/dL (ref 6.0–8.5)
eGFR: 69 mL/min/1.73 (ref >59)

## 2024-05-09 LAB — BAYER DCA HB A1C WAIVED: HB A1C (BAYER DCA - WAIVED): 10.5 % — ABNORMAL HIGH (ref 4.8–5.6)

## 2024-05-09 NOTE — Progress Notes (Signed)
 Subjective:  Patient ID: Jackson Booth, male    DOB: 1956/03/27  Age: 68 y.o. MRN: [id number]  CC: Medical Management of Chronic Issues (Right shoulder bothersome since fall in begin of October. Doing better now. Ongoing bursitis. )   HPI  Discussed the use of AI scribe software for clinical note transcription with the patient, who gave verbal consent to proceed.  History of Present Illness Jackson Booth is a 68 year old male with diabetes who presents with loss of sensation in his feet.  He is currently taking glipizide  for diabetes management. His recent A1c level is 10.5, which is slightly higher than a previous level of 10.3. He notes that his blood sugar levels were better controlled when he was more physically active. He wants to improve his exercise regimen to better manage his diabetes.He declines medication increase. Blood glucose readings reviewed, ranging from 150-350 fasting, with most in the 200s.           05/09/2024   11:45 AM 11/05/2023    9:50 AM 08/04/2023   10:26 AM  Depression screen PHQ 2/9  Decreased Interest 1 1 1   Down, Depressed, Hopeless 0 1 0  PHQ - 2 Score 1 2 1   Altered sleeping 1 1 3   Tired, decreased energy 1 1 3   Change in appetite 0 0 0  Feeling bad or failure about yourself  0 0 0  Trouble concentrating 1 0 1  Moving slowly or fidgety/restless 0 0 0  Suicidal thoughts 0 0 0  PHQ-9 Score 4 4  8    Difficult doing work/chores Not difficult at all Not difficult at all Not difficult at all     Data saved with a previous flowsheet row definition    History Jackson Booth has a past medical history of Anginal pain, Anxiety, Arthritis, CAD S/P percutaneous coronary angioplasty (08/07/2016), Family history of adverse reaction to anesthesia, High cholesterol, History of vertigo, Hypertension, 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   He has a past surgical history that includes LEFT HEART CATH  AND CORONARY ANGIOGRAPHY (N/A, 08/04/2016); CORONARY STENT INTERVENTION (N/A, 08/04/2016); Inguinal hernia repair (Right, 1986); Carpal tunnel release (Right, 1986); Nasal septum surgery (~ 1975; 1977); Coronary angioplasty with stent (; 08/04/2016; 08/07/2016); and CORONARY STENT INTERVENTION (N/A, 08/07/2016).   His family history includes Heart disease in his brother and mother; Hypertension in his father and mother.He reports that he has never smoked. He has never used smokeless tobacco. He reports that he does not drink alcohol and does not use drugs.    ROS Review of Systems  Constitutional: Negative.   HENT: Negative.    Eyes:  Negative for visual disturbance.  Respiratory:  Negative for cough and shortness of breath.   Cardiovascular:  Negative for chest pain and leg swelling.  Gastrointestinal:  Negative for abdominal pain, diarrhea, nausea and vomiting.  Genitourinary:  Negative for difficulty urinating.  Musculoskeletal:  Negative for arthralgias and myalgias.  Skin:  Negative for rash.  Neurological:  Negative for headaches.  Psychiatric/Behavioral:  Negative for sleep disturbance.     Objective:  BP (!) 147/81   Pulse 76   Temp (!) 97.5 F (36.4 C)   Ht 5' 9 (1.753 m)   Wt 168 lb (76.2 kg)   SpO2 96%   BMI 24.81 kg/m   BP Readings from Last 3 Encounters:  05/09/24 (!) 147/81  11/05/23 (!) 144/76  08/04/23 (!) 155/86    Wt  Readings from Last 3 Encounters:  05/09/24 168 lb (76.2 kg)  11/05/23 170 lb (77.1 kg)  08/04/23 168 lb (76.2 kg)     Physical Exam Vitals reviewed.  Constitutional:      Appearance: He is well-developed.  HENT:     Head: Normocephalic and atraumatic.     Right Ear: External ear normal.     Left Ear: External ear normal.     Mouth/Throat:     Pharynx: No oropharyngeal exudate or posterior oropharyngeal erythema.  Eyes:     Pupils: Pupils are equal, round, and reactive to light.  Cardiovascular:     Rate and Rhythm: Normal rate and  regular rhythm.     Heart sounds: No murmur heard. Pulmonary:     Effort: No respiratory distress.     Breath sounds: Normal breath sounds.  Musculoskeletal:     Cervical back: Normal range of motion and neck supple.  Neurological:     Mental Status: He is alert and oriented to person, place, and time.    Physical Exam GENERAL: Alert, cooperative, well developed, no acute distress. HEENT: Normocephalic, normal oropharynx, moist mucous membranes. CHEST: Clear to auscultation bilaterally, no wheezes, rhonchi, or crackles. CARDIOVASCULAR: Normal heart rate and rhythm, S1 and S2 normal without murmurs. ABDOMEN: Soft, non-tender, non-distended, without organomegaly, normal bowel sounds. EXTREMITIES: No cyanosis or edema. NEUROLOGICAL: Cranial nerves grossly intact, moves all extremities without gross motor deficit, decreased sensation in feet.   Assessment & Plan:  DM type 2 with diabetic dyslipidemia (HCC) -     Bayer DCA Hb A1c Waived -     Comprehensive metabolic panel with GFR -     Microalbumin / creatinine urine ratio  Coronary artery disease involving native coronary artery of native heart without angina pectoris -     Comprehensive metabolic panel with GFR  Prostate cancer screening  Screening for prostate cancer    Assessment and Plan Assessment & Plan Type 2 diabetes mellitus with peripheral neuropathy   He has type 2 diabetes mellitus with peripheral neuropathy, shown by loss of sensation in the feet. Hemoglobin A1c is 10.5, indicating poor glycemic control. He prefers to maintain the current management without increasing glipizide  or adding new medications. Emphasis is placed on diet and exercise, particularly exercise, to improve glycemic control. Continue the current diabetes management regimen. Schedule a follow-up in six months.       Follow-up: No follow-ups on file.  Butler Der, M.D.

## 2024-05-10 ENCOUNTER — Ambulatory Visit: Payer: Self-pay | Admitting: Family Medicine

## 2024-05-10 LAB — MICROALBUMIN / CREATININE URINE RATIO
Creatinine, Urine: 138.1 mg/dL
Microalb/Creat Ratio: 10 mg/g{creat} (ref 0–29)
Microalbumin, Urine: 13.9 ug/mL

## 2024-05-10 NOTE — Progress Notes (Signed)
Hello Nickolaos,  Your lab result is normal and/or stable.Some minor variations that are not significant are commonly marked abnormal, but do not represent any medical problem for you.  Best regards, Kaimana Neuzil, M.D.

## 2024-08-04 ENCOUNTER — Ambulatory Visit: Payer: Self-pay

## 2024-11-08 ENCOUNTER — Encounter: Admitting: Family Medicine
